# Patient Record
Sex: Female | Born: 1965
Health system: Southern US, Community
[De-identification: ages and names within clinical notes are randomized; demographics above are authoritative.]

## PROBLEM LIST (undated history)

## (undated) DIAGNOSIS — E785 Hyperlipidemia, unspecified: Secondary | ICD-10-CM

## (undated) DIAGNOSIS — Z86010 Personal history of colonic polyps: Secondary | ICD-10-CM

## (undated) DIAGNOSIS — R87619 Unspecified abnormal cytological findings in specimens from cervix uteri: Secondary | ICD-10-CM

## (undated) DIAGNOSIS — D219 Benign neoplasm of connective and other soft tissue, unspecified: Secondary | ICD-10-CM

## (undated) HISTORY — DX: Personal history of colonic polyps: Z86.010

## (undated) HISTORY — PX: COLONOSCOPY: SHX174

## (undated) HISTORY — DX: Hyperlipidemia, unspecified: E78.5

## (undated) HISTORY — DX: Unspecified abnormal cytological findings in specimens from cervix uteri: R87.619

## (undated) HISTORY — DX: Benign neoplasm of connective and other soft tissue, unspecified: D21.9

---

## 1997-12-23 ENCOUNTER — Encounter: Admission: RE | Admit: 1997-12-23 | Discharge: 1997-12-23 | Payer: Self-pay | Admitting: Sports Medicine

## 1998-02-20 ENCOUNTER — Encounter: Admission: RE | Admit: 1998-02-20 | Discharge: 1998-02-20 | Payer: Self-pay | Admitting: Family Medicine

## 1998-02-22 ENCOUNTER — Encounter: Admission: RE | Admit: 1998-02-22 | Discharge: 1998-05-23 | Payer: Self-pay | Admitting: *Deleted

## 1998-03-21 ENCOUNTER — Encounter: Admission: RE | Admit: 1998-03-21 | Discharge: 1998-03-21 | Payer: Self-pay | Admitting: Sports Medicine

## 1998-05-31 ENCOUNTER — Encounter: Admission: RE | Admit: 1998-05-31 | Discharge: 1998-05-31 | Payer: Self-pay | Admitting: Family Medicine

## 1998-06-07 ENCOUNTER — Encounter: Admission: RE | Admit: 1998-06-07 | Discharge: 1998-06-07 | Payer: Self-pay | Admitting: Family Medicine

## 1998-07-07 ENCOUNTER — Encounter: Admission: RE | Admit: 1998-07-07 | Discharge: 1998-07-07 | Payer: Self-pay | Admitting: Family Medicine

## 1998-08-21 ENCOUNTER — Encounter: Admission: RE | Admit: 1998-08-21 | Discharge: 1998-08-21 | Payer: Self-pay | Admitting: Sports Medicine

## 1999-02-20 ENCOUNTER — Encounter: Admission: RE | Admit: 1999-02-20 | Discharge: 1999-02-20 | Payer: Self-pay | Admitting: Family Medicine

## 1999-07-17 ENCOUNTER — Encounter: Admission: RE | Admit: 1999-07-17 | Discharge: 1999-07-17 | Payer: Self-pay | Admitting: Family Medicine

## 1999-10-24 ENCOUNTER — Encounter: Admission: RE | Admit: 1999-10-24 | Discharge: 1999-10-24 | Payer: Self-pay | Admitting: Family Medicine

## 2000-07-29 ENCOUNTER — Encounter: Admission: RE | Admit: 2000-07-29 | Discharge: 2000-07-29 | Payer: Self-pay | Admitting: Family Medicine

## 2000-08-06 ENCOUNTER — Encounter: Admission: RE | Admit: 2000-08-06 | Discharge: 2000-08-06 | Payer: Self-pay | Admitting: Family Medicine

## 2000-10-31 ENCOUNTER — Encounter: Admission: RE | Admit: 2000-10-31 | Discharge: 2000-10-31 | Payer: Self-pay | Admitting: Family Medicine

## 2001-03-26 ENCOUNTER — Encounter: Admission: RE | Admit: 2001-03-26 | Discharge: 2001-03-26 | Payer: Self-pay | Admitting: Family Medicine

## 2001-08-28 ENCOUNTER — Encounter: Admission: RE | Admit: 2001-08-28 | Discharge: 2001-08-28 | Payer: Self-pay | Admitting: Family Medicine

## 2002-03-05 ENCOUNTER — Encounter: Admission: RE | Admit: 2002-03-05 | Discharge: 2002-03-05 | Payer: Self-pay | Admitting: Family Medicine

## 2002-08-12 ENCOUNTER — Encounter: Admission: RE | Admit: 2002-08-12 | Discharge: 2002-08-12 | Payer: Self-pay | Admitting: Sports Medicine

## 2002-08-16 ENCOUNTER — Encounter: Admission: RE | Admit: 2002-08-16 | Discharge: 2002-08-16 | Payer: Self-pay | Admitting: Family Medicine

## 2002-08-30 ENCOUNTER — Encounter: Admission: RE | Admit: 2002-08-30 | Discharge: 2002-08-30 | Payer: Self-pay | Admitting: Family Medicine

## 2003-11-01 ENCOUNTER — Encounter: Admission: RE | Admit: 2003-11-01 | Discharge: 2003-11-01 | Payer: Self-pay | Admitting: Family Medicine

## 2004-09-21 ENCOUNTER — Ambulatory Visit: Payer: Self-pay | Admitting: Family Medicine

## 2004-12-29 ENCOUNTER — Encounter (INDEPENDENT_AMBULATORY_CARE_PROVIDER_SITE_OTHER): Payer: Self-pay | Admitting: *Deleted

## 2004-12-29 LAB — CONVERTED CEMR LAB

## 2005-01-15 ENCOUNTER — Ambulatory Visit: Payer: Self-pay | Admitting: Family Medicine

## 2005-01-17 ENCOUNTER — Ambulatory Visit (HOSPITAL_COMMUNITY): Admission: RE | Admit: 2005-01-17 | Discharge: 2005-01-17 | Payer: Self-pay | Admitting: Family Medicine

## 2006-03-03 ENCOUNTER — Ambulatory Visit: Payer: Self-pay | Admitting: Family Medicine

## 2006-03-07 ENCOUNTER — Ambulatory Visit (HOSPITAL_COMMUNITY): Admission: RE | Admit: 2006-03-07 | Discharge: 2006-03-07 | Payer: Self-pay | Admitting: Family Medicine

## 2006-03-14 ENCOUNTER — Ambulatory Visit: Payer: Self-pay | Admitting: Family Medicine

## 2006-09-24 ENCOUNTER — Ambulatory Visit: Payer: Self-pay | Admitting: Family Medicine

## 2006-10-23 DIAGNOSIS — M545 Low back pain, unspecified: Secondary | ICD-10-CM | POA: Insufficient documentation

## 2006-10-23 DIAGNOSIS — E739 Lactose intolerance, unspecified: Secondary | ICD-10-CM | POA: Insufficient documentation

## 2006-10-24 ENCOUNTER — Encounter (INDEPENDENT_AMBULATORY_CARE_PROVIDER_SITE_OTHER): Payer: Self-pay | Admitting: *Deleted

## 2007-06-23 ENCOUNTER — Encounter: Payer: Self-pay | Admitting: Family Medicine

## 2007-06-23 ENCOUNTER — Ambulatory Visit: Payer: Self-pay | Admitting: Family Medicine

## 2007-11-06 ENCOUNTER — Encounter: Payer: Self-pay | Admitting: Family Medicine

## 2008-04-15 ENCOUNTER — Telehealth: Payer: Self-pay | Admitting: Family Medicine

## 2008-05-30 ENCOUNTER — Telehealth: Payer: Self-pay | Admitting: *Deleted

## 2008-05-30 ENCOUNTER — Ambulatory Visit: Payer: Self-pay | Admitting: Family Medicine

## 2008-05-30 ENCOUNTER — Encounter: Payer: Self-pay | Admitting: Family Medicine

## 2008-05-30 DIAGNOSIS — R300 Dysuria: Secondary | ICD-10-CM | POA: Insufficient documentation

## 2008-05-30 DIAGNOSIS — R109 Unspecified abdominal pain: Secondary | ICD-10-CM | POA: Insufficient documentation

## 2008-05-30 LAB — CONVERTED CEMR LAB
ALT: 17 units/L (ref 0–35)
Albumin: 4.4 g/dL (ref 3.5–5.2)
Alkaline Phosphatase: 50 units/L (ref 39–117)
CO2: 21 meq/L (ref 19–32)
Calcium: 9.2 mg/dL (ref 8.4–10.5)
Chloride: 105 meq/L (ref 96–112)
Cholesterol: 192 mg/dL (ref 0–200)
Hemoglobin: 12.6 g/dL (ref 12.0–15.0)
MCV: 91.4 fL (ref 78.0–100.0)
Nitrite: NEGATIVE
Platelets: 286 10*3/uL (ref 150–400)
Potassium: 4.1 meq/L (ref 3.5–5.3)
Protein, U semiquant: NEGATIVE
RBC: 4.18 M/uL (ref 3.87–5.11)
Sodium: 138 meq/L (ref 135–145)
Total CHOL/HDL Ratio: 2.6
Triglycerides: 51 mg/dL (ref <150)
Urobilinogen, UA: 0.2
VLDL: 10 mg/dL (ref 0–40)
WBC Urine, dipstick: NEGATIVE

## 2008-10-04 ENCOUNTER — Encounter: Payer: Self-pay | Admitting: Family Medicine

## 2008-10-04 ENCOUNTER — Ambulatory Visit: Payer: Self-pay | Admitting: Family Medicine

## 2008-10-06 ENCOUNTER — Encounter: Payer: Self-pay | Admitting: Family Medicine

## 2008-10-24 ENCOUNTER — Ambulatory Visit (HOSPITAL_COMMUNITY): Admission: RE | Admit: 2008-10-24 | Discharge: 2008-10-24 | Payer: Self-pay | Admitting: Internal Medicine

## 2009-06-13 LAB — CONVERTED CEMR LAB
HDL: 63.5 mg/dL
LDL Cholesterol: 108 mg/dL
Total CHOL/HDL Ratio: 1.7
Triglycerides: 63 mg/dL

## 2009-10-06 ENCOUNTER — Ambulatory Visit: Payer: Self-pay | Admitting: Family Medicine

## 2009-10-06 ENCOUNTER — Encounter: Payer: Self-pay | Admitting: Family Medicine

## 2009-10-06 LAB — CONVERTED CEMR LAB
ALT: 13 units/L (ref 0–35)
Albumin: 4.2 g/dL (ref 3.5–5.2)
Alkaline Phosphatase: 47 units/L (ref 39–117)
BUN: 14 mg/dL (ref 6–23)
Blood in Urine, dipstick: NEGATIVE
Calcium: 9.4 mg/dL (ref 8.4–10.5)
Chloride: 105 meq/L (ref 96–112)
Glucose, Bld: 102 mg/dL — ABNORMAL HIGH (ref 70–99)
HCT: 38.6 % (ref 36.0–46.0)
MCHC: 32.9 g/dL (ref 30.0–36.0)
Pap Smear: NEGATIVE
Potassium: 4 meq/L (ref 3.5–5.3)
Protein, U semiquant: NEGATIVE
Sodium: 138 meq/L (ref 135–145)
Total Bilirubin: 0.5 mg/dL (ref 0.3–1.2)
Urobilinogen, UA: 0.2
Whiff Test: NEGATIVE

## 2009-10-09 ENCOUNTER — Encounter: Payer: Self-pay | Admitting: Family Medicine

## 2010-09-20 ENCOUNTER — Ambulatory Visit (HOSPITAL_COMMUNITY)
Admission: RE | Admit: 2010-09-20 | Discharge: 2010-09-20 | Payer: Self-pay | Source: Home / Self Care | Attending: Family Medicine | Admitting: Family Medicine

## 2010-09-25 NOTE — Letter (Signed)
Summary: Generic Letter  [facility]  [address]   [place], [place] [address]   Phone: [phone]  Fax: [phone]    10/09/2009  Samantha Key [address] [place], [place]  [address]  Dear [name],   All of your lab work was normal.  Your fasting blood sugar was 102, a little high but not diagnostic of diabetes.  I recommend exercise and weight loss to prevent developing diabetes.        Sincerely,   Luretha Murphy NP  Appended Document: Generic Letter mailed.

## 2010-09-25 NOTE — Assessment & Plan Note (Signed)
Summary: cpe,tcb   Vital Signs:  Patient profile:   45 year old female Height:      59.5 inches Weight:      153.4 pounds BMI:     30.57 Pulse rate:   80 / minute BP sitting:   110 / 70  (right arm)  Vitals Entered By: Arlyss Repress CMA, (October 06, 2009 8:36 AM) CC: physical with pap. re-check urine. Is Patient Diabetic? No Pain Assessment Patient in pain? no        CC:  physical with pap. re-check urine.Marland Kitchen  History of Present Illness: Here for yearly CPE, she still has complaint of her urine having a odd appearance when in the toilet, and the warm sensation over her abdomen.  This have been chronic ongoing sensations for which we have not found a cause.    Otherwise she admits to being lazy and not exercising, she eats healthy but also consumes a fair amount of sugar.  She had a recent lipid panel, and HIV test for insurance.    Back pain is improved, she does have pain down her right legs occasionally.  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  Current Medications (verified): 1)  None  Review of Systems General:  Denies fatigue, malaise, sleep disorder, and sweats. CV:  Complains of swelling of feet; denies chest pain or discomfort. Resp:  Denies cough and wheezing. GI:  Complains of abdominal pain; denies constipation and diarrhea. GU:  Complains of dysuria and urinary frequency; denies discharge and urinary hesitancy. MS:  Complains of low back pain. Derm:  Denies dryness, itching, and rash. Psych:  Denies depression.  Physical Exam  General:  alert, well-developed, well-nourished, and overweight-appearing.   Ears:  External ear exam shows no significant lesions or deformities.  Otoscopic examination reveals clear canals, tympanic membranes are intact bilaterally without bulging, retraction, inflammation or discharge. Hearing is grossly normal bilaterally. Mouth:  Oral mucosa and oropharynx without lesions or exudates.  Teeth in good  repair. Neck:  No deformities, masses, or tenderness noted. Breasts:  No mass, nodules, thickening, tenderness, bulging, retraction, inflamation, nipple discharge or skin changes noted.   Lungs:  Normal respiratory effort, chest expands symmetrically. Lungs are clear to auscultation, no crackles or wheezes. Heart:  Normal rate and regular rhythm. S1 and S2 normal without gallop, murmur, click, rub or other extra sounds. Abdomen:  Bowel sounds positive,abdomen soft and non-tender without masses, organomegaly or hernias noted. Genitalia:  Normal introitus for age, no external lesions, no vaginal discharge, mucosa pink and moist, no vaginal or cervical lesions, no vaginal atrophy, no friaility or hemorrhage, normal uterus size and position, no adnexal masses or tenderness Msk:  No deformity or scoliosis noted of thoracic or lumbar spine.   Pulses:  R and L carotid,radial,femoral,dorsalis pedis and posterior tibial pulses are full and equal bilaterally Extremities:  No clubbing, cyanosis, edema, or deformity noted with normal full range of motion of all joints.   Neurologic:  alert & oriented X3, strength normal in all extremities, and gait normal.   Skin:  Hyperpigmentation of neck, under arms and line across abdomen, Cervical Nodes:  No lymphadenopathy noted Axillary Nodes:  No palpable lymphadenopathy Inguinal Nodes:  No significant adenopathy   Impression & Recommendations:  Problem # 1:  SCREENING FOR MALIGNANT NEOPLASM OF THE CERVIX (ICD-V76.2)  Orders: Pap Smear-[facility] ([id number]) [facility] - Est  40-64 yrs ([address])  Problem # 2:  ABDOMINAL PAIN, UNSPECIFIED SITE (ICD-789.00) Add type  burning type pain that is intermittent and has been present for over one year. [phone] Comp Met-[facility] [phone]) CBC-[facility] ([address])  Problem # 3:  DYSURIA (ICD-788.1) Normal UA, high Sp Grvity:  encouraged increase in water intake Orders: Urinalysis-[facility] (00000)  Problem # 4:  GLUCOSE  INTOLERANCE (ICD-271.3)  Check CMET today  Orders: [facility] - Est  40-64 yrs ([address])  Patient Instructions: 1)  You are doing well, keep up the good work. 2)  Try not to be lazy and begin at least a walking program 4-5 times per week for 30 minutes. 3)  Mammogram in one year, every 2 year schedule until 48.  Laboratory Results   Urine Tests  Date/Time Received: October 06, 2009 8:46 AM  Date/Time Reported: October 06, 2009 8:54 AM   Routine Urinalysis   Color: yellow Appearance: Clear Glucose: negative   (Normal Range: Negative) Bilirubin: negative   (Normal Range: Negative) Ketone: negative   (Normal Range: Negative) Spec. Gravity: >=1.030   (Normal Range: 1.003-1.035) Blood: negative   (Normal Range: Negative) pH: 5.5   (Normal Range: 5.0-8.0) Protein: negative   (Normal Range: Negative) Urobilinogen: 0.2   (Normal Range: 0-1) Nitrite: negative   (Normal Range: Negative) Leukocyte Esterace: negative   (Normal Range: Negative)    Comments: ...............test performed by......Marland KitchenBonnie A. Swazil[place], MLS (ASCP)cm  Date/Time Received: October 06, 2009 9:07 AM  Date/Time Reported: October 06, 2009 9:13 AM   [facility] Source: vaginal WBC/hpf: 0-2 Bacteria/hpf: 2+  Rods Clue cells/hpf: none  Negative whiff Yeast/hpf: none Trichomonas/hpf: none Comments: ...........test performed by...........Marland KitchenTerese Door, CMA

## 2010-09-25 NOTE — Miscellaneous (Signed)
  Clinical Lists Changes  Observations: Added new observation of PAPRECACT: Ordered (10/06/2009 9:28) Added new observation of FLUVAXDECLN: Refused (10/06/2009 9:28) Added new observation of DM PROGRESS: N/A (10/06/2009 9:28) Added new observation of DM FSREVIEW: N/A (10/06/2009 9:28) Added new observation of HTN PROGRESS: N/A (10/06/2009 9:28) Added new observation of HTN FSREVIEW: N/A (10/06/2009 9:28) Added new observation of LIPID PROGRS: N/A (10/06/2009 9:28) Added new observation of LIPID FSREVW: N/A (10/06/2009 9:28) Added new observation of MAMMOGRAM: Done through scholarship at [facility] test (11/28/2008 9:29)      Prevention & Chronic Care Immunizations   Influenza vaccine: Not documented   Influenza vaccine deferral: Refused  (10/06/2009)   Influenza vaccine due: Refused  (10/04/2008)    Tetanus booster: 06/27/1999: Done.   Tetanus booster due: 06/26/2009    Pneumococcal vaccine: Not documented  Other Screening   Pap smear: NEGATIVE FOR INTRAEPITHELIAL LESIONS OR MALIGNANCY.  (10/04/2008)   Pap smear action/deferral: Ordered  (10/06/2009)   Pap smear due: 12/29/2005    Mammogram: Done through scholarship at [facility] test  (11/28/2008)   Mammogram due: 03/02/2007   Smoking status: never  (10/04/2008)  Lipids   Total Cholesterol: 185  (06/13/2009)   LDL: 108  (06/13/2009)   LDL Direct: Not documented   HDL: 63.5  (06/13/2009)   Triglycerides: 63  (06/13/2009)    Mammogram  Procedure date:  11/28/2008  Findings:      Done through scholarship at [facility] test

## 2010-10-19 ENCOUNTER — Encounter: Payer: Self-pay | Admitting: Family Medicine

## 2010-10-19 ENCOUNTER — Ambulatory Visit (INDEPENDENT_AMBULATORY_CARE_PROVIDER_SITE_OTHER): Payer: BC Managed Care – PPO | Admitting: Family Medicine

## 2010-10-19 DIAGNOSIS — Z124 Encounter for screening for malignant neoplasm of cervix: Secondary | ICD-10-CM

## 2010-10-19 DIAGNOSIS — N760 Acute vaginitis: Secondary | ICD-10-CM

## 2010-10-19 DIAGNOSIS — E669 Obesity, unspecified: Secondary | ICD-10-CM

## 2010-10-19 DIAGNOSIS — R7309 Other abnormal glucose: Secondary | ICD-10-CM

## 2010-10-19 DIAGNOSIS — R7302 Impaired glucose tolerance (oral): Secondary | ICD-10-CM

## 2010-10-19 DIAGNOSIS — Z0000 Encounter for general adult medical examination without abnormal findings: Secondary | ICD-10-CM | POA: Insufficient documentation

## 2010-10-19 DIAGNOSIS — Z23 Encounter for immunization: Secondary | ICD-10-CM

## 2010-10-19 LAB — CBC
HCT: 39.3 % (ref 36.0–46.0)
Hemoglobin: 12.6 g/dL (ref 12.0–15.0)
MCHC: 32.1 g/dL (ref 30.0–36.0)
Platelets: 294 10*3/uL (ref 150–400)

## 2010-10-19 LAB — CONVERTED CEMR LAB
AST: 21 units/L (ref 0–37)
Albumin: 4.3 g/dL (ref 3.5–5.2)
Cholesterol: 191 mg/dL (ref 0–200)
Glucose, Bld: 98 mg/dL (ref 70–99)
HCT: 39.3 % (ref 36.0–46.0)
MCHC: 32.1 g/dL (ref 30.0–36.0)
MCV: 93.1 fL (ref 78.0–100.0)
Platelets: 294 10*3/uL (ref 150–400)
Potassium: 4 meq/L (ref 3.5–5.3)
RDW: 12.9 % (ref 11.5–15.5)
TSH: 1.059 microintl units/mL (ref 0.350–4.500)
Total Bilirubin: 0.5 mg/dL (ref 0.3–1.2)

## 2010-10-19 LAB — COMPREHENSIVE METABOLIC PANEL
ALT: 23 U/L (ref 0–35)
AST: 21 U/L (ref 0–37)
Albumin: 4.3 g/dL (ref 3.5–5.2)
Alkaline Phosphatase: 47 U/L (ref 39–117)
BUN: 15 mg/dL (ref 6–23)
Calcium: 9.6 mg/dL (ref 8.4–10.5)
Creat: 0.71 mg/dL (ref 0.40–1.20)
Glucose, Bld: 98 mg/dL (ref 70–99)
Potassium: 4 mEq/L (ref 3.5–5.3)
Total Protein: 7.8 g/dL (ref 6.0–8.3)

## 2010-10-19 LAB — POCT WET PREP (WET MOUNT): Yeast Wet Prep HPF POC: NEGATIVE

## 2010-10-19 NOTE — Progress Notes (Signed)
  Subjective:    Patient ID: Samantha Key, female    DOB: [birth year], 45 y.o.   MRN: [id number]  HPI : Patient is here for annual exam and PAP.  She reports only a few complaints.  She has a hot sensation in her abdomen that she has described for years that comes and goes.  She denies pain, or associated symptoms.  She has a small amount of yellow discharge, menses are regular, has occasional hot flashes.    Review of Systems  Constitutional: Negative for activity change, appetite change and unexpected weight change.  HENT: Negative for congestion.   Eyes: Negative for visual disturbance.  Respiratory: Negative for cough and shortness of breath.   Cardiovascular: Negative for chest pain and leg swelling.  Genitourinary: Positive for vaginal discharge. Negative for dysuria.  Musculoskeletal: Negative for back pain.  Neurological: Negative for headaches.  Psychiatric/Behavioral: Negative for dysphoric mood.       Objective:   Physical Exam  Constitutional: She is oriented to person, place, and time. She appears well-developed.       Overweight appearing  HENT:  Right Ear: External ear normal.  Left Ear: External ear normal.  Nose: Nose normal.  Mouth/Throat: Oropharynx is clear and moist.  Eyes: Conjunctivae and EOM are normal. Pupils are equal, round, and reactive to light.  Neck: Normal range of motion.  Cardiovascular: Normal rate, regular rhythm, normal heart sounds and intact distal pulses.   Pulmonary/Chest: Effort normal and breath sounds normal.  Abdominal: Soft. Bowel sounds are normal.  Genitourinary: Vagina normal and uterus normal.  Musculoskeletal: Normal range of motion.  Neurological: She is alert and oriented to person, place, and time.  Skin: Skin is warm and dry.          Assessment & Plan:

## 2010-10-19 NOTE — Assessment & Plan Note (Signed)
Completed, can probably go to every 2 year screening.  Trich found on wet mount, will call patient to discuss treatment plan.

## 2010-10-19 NOTE — Assessment & Plan Note (Signed)
Routine labs, no abnormal findings

## 2010-10-19 NOTE — Patient Instructions (Signed)
Best way to loose weight is by exercising and changing diet to include natural food from the earth Eliminate sugars, do not drink calories-just water Meat at 3 ounces a day, size of a deck of cards I will send you the results of your PAP and labs in the mail, and will call you if anything is abnormal

## 2010-10-22 ENCOUNTER — Other Ambulatory Visit: Payer: Self-pay | Admitting: Family Medicine

## 2010-10-22 DIAGNOSIS — A5901 Trichomonal vulvovaginitis: Secondary | ICD-10-CM | POA: Insufficient documentation

## 2010-10-22 MED ORDER — METRONIDAZOLE 500 MG PO TABS: 500.0000 mg | ORAL_TABLET | ORAL | Status: AC

## 2010-10-26 ENCOUNTER — Encounter: Payer: Self-pay | Admitting: Family Medicine

## 2010-10-26 ENCOUNTER — Ambulatory Visit (INDEPENDENT_AMBULATORY_CARE_PROVIDER_SITE_OTHER): Payer: BC Managed Care – PPO | Admitting: Family Medicine

## 2010-10-26 VITALS — BP 142/86 | HR 119 | Temp 99.4°F | Wt 155.5 lb

## 2010-10-26 DIAGNOSIS — A5901 Trichomonal vulvovaginitis: Secondary | ICD-10-CM

## 2010-10-26 DIAGNOSIS — N898 Other specified noninflammatory disorders of vagina: Secondary | ICD-10-CM

## 2010-10-26 NOTE — Assessment & Plan Note (Addendum)
Three confirmation tests, personally reviewed wet mount slide + mobile trich

## 2010-10-26 NOTE — Progress Notes (Signed)
  Subjective:    Patient ID: Samantha Key, female    DOB: [birth year], 45 y.o.   MRN: [id number]  HPI Patient is very upset that trich was detected on wet prep and confirmed on PAP.  She does not believe this, she and her husband have gone round and round.  She looked it up on the intranet and found that it can be transmitted by toilet seats and that is can be chronic. One year ago she had a PAP with no report of trich.   Today she also brought in a cup of water and a bottle of household bleach, she "demonstrated" how her urine looks in the toilet.  She has complained about this many times and for years.  She thinks there is something wrong because her urine does not mix with the water in the toilet.  I have explained that it is a different specific gravity and that she can see a specialist if she prefers.   Review of Systems  Genitourinary: Negative for dysuria, vaginal bleeding, vaginal discharge, difficulty urinating, genital sores, vaginal pain, pelvic pain and dyspareunia.  Psychiatric/Behavioral:       Very upset       Objective:   Physical Exam  Constitutional: She appears well-developed.  Genitourinary: Vagina normal. No vaginal discharge found.       Normal external exam, vaginal walls pink and dry.  No discharge.          Assessment & Plan:

## 2010-12-11 LAB — GLUCOSE, CAPILLARY: Glucose-Capillary: 101 mg/dL — ABNORMAL HIGH (ref 70–99)

## 2011-09-16 ENCOUNTER — Other Ambulatory Visit: Payer: Self-pay | Admitting: Family Medicine

## 2011-09-16 DIAGNOSIS — Z1231 Encounter for screening mammogram for malignant neoplasm of breast: Secondary | ICD-10-CM

## 2011-09-24 ENCOUNTER — Ambulatory Visit (INDEPENDENT_AMBULATORY_CARE_PROVIDER_SITE_OTHER): Payer: BC Managed Care – PPO | Admitting: Family Medicine

## 2011-09-24 ENCOUNTER — Encounter: Payer: Self-pay | Admitting: Family Medicine

## 2011-09-24 VITALS — BP 151/86 | HR 67 | Temp 98.2°F | Ht 59.5 in | Wt 160.0 lb

## 2011-09-24 DIAGNOSIS — M5430 Sciatica, unspecified side: Secondary | ICD-10-CM

## 2011-09-24 MED ORDER — NAPROXEN 500 MG PO TABS: 500.0000 mg | ORAL_TABLET | Freq: Two times a day (BID) | ORAL | Status: DC

## 2011-09-24 NOTE — Patient Instructions (Addendum)
Sciatica Sciatica is a weakness and/or changes in sensation (tingling, jolts, hot and cold, numbness) along the path the sciatic nerve travels. Irritation or damage to lumbar nerve roots is often also referred to as lumbar radiculopathy.  Lumbar radiculopathy (Sciatica) is the most common form of this problem. Radiculopathy can occur in any of the nerves coming out of the spinal cord. The problems caused depend on which nerves are involved. The sciatic nerve is the large nerve supplying the branches of nerves going from the hip to the toes. It often causes a numbness or weakness in the skin and/or muscles that the sciatic nerve serves. It also may cause symptoms (problems) of pain, burning, tingling, or electric shock-like feelings in the path of this nerve. This usually comes from injury to the fibers that make up the sciatic nerve. Some of these symptoms are low back pain and/or unpleasant feelings in the following areas:  From the mid-buttock down the back of the leg to the back of the knee.   And/or the outside of the calf and top of the foot.   And/or behind the inner ankle to the sole of the foot.  CAUSES   Herniated or slipped disc. Discs are the little cushions between the bones in the back.   Pressure by the piriformis muscle in the buttock on the sciatic nerve (Piriformis Syndrome).   Misalignment of the bones in the lower back and buttocks (Sacroiliac Joint Derangement).   Narrowing of the spinal canal that puts pressure on or pinches the fibers that make up the sciatic nerve.   A slipped vertebra that is out of line with those above or beneath it.   Abnormality of the nervous system itself so that nerve fibers do not transmit signals properly, especially to feet and calves (neuropathy).   Tumor (this is rare).  Your caregiver can usually determine the cause of your sciatica and begin the treatment most likely to help you. TREATMENT  Taking over-the-counter painkillers, physical  therapy, rest, exercise, spinal manipulation, and injections of anesthetics and/or steroids may be used. Surgery, acupuncture, and Yoga can also be effective. Mind over matter techniques, mental imagery, and changing factors such as your bed, chair, desk height, posture, and activities are other treatments that may be helpful. You and your caregiver can help determine what is best for you. With proper diagnosis, the cause of most sciatica can be identified and removed. Communication and cooperation between your caregiver and you is essential. If you are not successful immediately, do not be discouraged. With time, a proper treatment can be found that will make you comfortable. HOME CARE INSTRUCTIONS   If the pain is coming from a problem in the back, applying ice to that area for 15 to 20 minutes, 3 to 4 times per day while awake, may be helpful. Put the ice in a plastic bag. Place a towel between the bag of ice and your skin.   You may exercise or perform your usual activities if these do not aggravate your pain, or as suggested by your caregiver.   Only take over-the-counter or prescription medicines for pain, discomfort, or fever as directed by your caregiver.   If your caregiver has given you a follow-up appointment, it is very important to keep that appointment. Not keeping the appointment could result in a chronic or permanent injury, pain, and disability. If there is any problem keeping the appointment, you must call back to this facility for assistance.  SEEK IMMEDIATE MEDICAL CARE   IF:   You experience loss of control of bowel or bladder.   You have increasing weakness in the trunk, buttocks, or legs.   There is numbness in any areas from the hip down to the toes.   You have difficulty walking or keeping your balance.   You have any of the above, with fever or forceful vomiting.  Document Released: 08/06/2001 Document Revised: 04/24/2011 Document Reviewed: 03/25/2008 [facility] Patient  Information 2012 [place], [place]. Arterial Hypertension Arterial hypertension (high blood pressure) is a condition of elevated pressure in your blood vessels. Hypertension over a long period of time is a risk factor for strokes, heart attacks, and heart failure. It is also the leading cause of kidney (renal) failure.  CAUSES   In Adults -- Over 90% of all hypertension has no known cause. This is called essential or primary hypertension. In the other 10% of people with hypertension, the increase in blood pressure is caused by another disorder. This is called secondary hypertension. Important causes of secondary hypertension are:   Heavy alcohol use.   Obstructive sleep apnea.   Hyperaldosterosim (Conn's syndrome).   Steroid use.   Chronic kidney failure.   Hyperparathyroidism.   Medications.   Renal artery stenosis.   Pheochromocytoma.   Cushing's disease.   Coarctation of the aorta.   Scleroderma renal crisis.   Licorice (in excessive amounts).   Drugs (cocaine, methamphetamine).  Your caregiver can explain any items above that apply to you.  In Children -- Secondary hypertension is more common and should always be considered.   Pregnancy -- Few women of childbearing age have high blood pressure. However, up to 10% of them develop hypertension of pregnancy. Generally, this will not harm the woman. It may be a sign of 3 complications of pregnancy: preeclampsia, HELLP syndrome, and eclampsia. Follow up and control with medication is necessary.  SYMPTOMS   This condition normally does not produce any noticeable symptoms. It is usually found during a routine exam.   Malignant hypertension is a late problem of high blood pressure. It may have the following symptoms:   Headaches.   Blurred vision.   End-organ damage (this means your kidneys, heart, lungs, and other organs are being damaged).   Stressful situations can increase the blood pressure. If a person with normal  blood pressure has their blood pressure go up while being seen by their caregiver, this is often termed "white coat hypertension." Its importance is not known. It may be related with eventually developing hypertension or complications of hypertension.   Hypertension is often confused with mental tension, stress, and anxiety.  DIAGNOSIS  The diagnosis is made by 3 separate blood pressure measurements. They are taken at least 1 week apart from each other. If there is organ damage from hypertension, the diagnosis may be made without repeat measurements. Hypertension is usually identified by having blood pressure readings:  Above 140/90 mmHg measured in both arms, at 3 separate times, over a couple weeks.   Over 130/80 mmHg should be considered a risk factor and may require treatment in patients with diabetes.  Blood pressure readings over 120/80 mmHg are called "pre-hypertension" even in non-diabetic patients. To get a true blood pressure measurement, use the following guidelines. Be aware of the factors that can alter blood pressure readings.  Take measurements at least 1 hour after caffeine.   Take measurements 30 minutes after smoking and without any stress. This is another reason to quit smoking - it raises your blood pressure.  Use a proper cuff size. Ask your caregiver if you are not sure about your cuff size.   Most home blood pressure cuffs are automatic. They will measure systolic and diastolic pressures. The systolic pressure is the pressure reading at the start of sounds. Diastolic pressure is the pressure at which the sounds disappear. If you are elderly, measure pressures in multiple postures. Try sitting, lying or standing.   Sit at rest for a minimum of 5 minutes before taking measurements.   You should not be on any medications like decongestants. These are found in many cold medications.   Record your blood pressure readings and review them with your caregiver.  If you have  hypertension:  Your caregiver may do tests to be sure you do not have secondary hypertension (see "causes" above).   Your caregiver may also look for signs of metabolic syndrome. This is also called Syndrome X or Insulin Resistance Syndrome. You may have this syndrome if you have type 2 diabetes, abdominal obesity, and abnormal blood lipids in addition to hypertension.   Your caregiver will take your medical and family history and perform a physical exam.   Diagnostic tests may include blood tests (for glucose, cholesterol, potassium, and kidney function), a urinalysis, or an EKG. Other tests may also be necessary depending on your condition.  PREVENTION  There are important lifestyle issues that you can adopt to reduce your chance of developing hypertension:  Maintain a normal weight.   Limit the amount of salt (sodium) in your diet.   Exercise often.   Limit alcohol intake.   Get enough potassium in your diet. Discuss specific advice with your caregiver.   Follow a DASH diet (dietary approaches to stop hypertension). This diet is rich in fruits, vegetables, and low-fat dairy products, and avoids certain fats.  PROGNOSIS  Essential hypertension cannot be cured. Lifestyle changes and medical treatment can lower blood pressure and reduce complications. The prognosis of secondary hypertension depends on the underlying cause. Many people whose hypertension is controlled with medicine or lifestyle changes can live a normal, healthy life.  RISKS AND COMPLICATIONS  While high blood pressure alone is not an illness, it often requires treatment due to its short- and long-term effects on many organs. Hypertension increases your risk for:  CVAs or strokes (cerebrovascular accident).   Heart failure due to chronically high blood pressure (hypertensive cardiomyopathy).   Heart attack (myocardial infarction).   Damage to the retina (hypertensive retinopathy).   Kidney failure (hypertensive  nephropathy).  Your caregiver can explain list items above that apply to you. Treatment of hypertension can significantly reduce the risk of complications. TREATMENT   For overweight patients, weight loss and regular exercise are recommended. Physical fitness lowers blood pressure.   Mild hypertension is usually treated with diet and exercise. A diet rich in fruits and vegetables, fat-free dairy products, and foods low in fat and salt (sodium) can help lower blood pressure. Decreasing salt intake decreases blood pressure in a 1/3 of people.   Stop smoking if you are a smoker.  The steps above are highly effective in reducing blood pressure. While these actions are easy to suggest, they are difficult to achieve. Most patients with moderate or severe hypertension end up requiring medications to bring their blood pressure down to a normal level. There are several classes of medications for treatment. Blood pressure pills (antihypertensives) will lower blood pressure by their different actions. Lowering the blood pressure by 10 mmHg may decrease the risk of  complications by as much as 25%. The goal of treatment is effective blood pressure control. This will reduce your risk for complications. Your caregiver will help you determine the best treatment for you according to your lifestyle. What is excellent treatment for one person, may not be for you. HOME CARE INSTRUCTIONS   Do not smoke.   Follow the lifestyle changes outlined in the "Prevention" section.   If you are on medications, follow the directions carefully. Blood pressure medications must be taken as prescribed. Skipping doses reduces their benefit. It also puts you at risk for problems.   Follow up with your caregiver, as directed.   If you are asked to monitor your blood pressure at home, follow the guidelines in the "Diagnosis" section above.  SEEK MEDICAL CARE IF:   You think you are having medication side effects.   You have  recurrent headaches or lightheadedness.   You have swelling in your ankles.   You have trouble with your vision.  SEEK IMMEDIATE MEDICAL CARE IF:   You have sudden onset of chest pain or pressure, difficulty breathing, or other symptoms of a heart attack.   You have a severe headache.   You have symptoms of a stroke (such as sudden weakness, difficulty speaking, difficulty walking).  MAKE SURE YOU:   Understand these instructions.   Will watch your condition.   Will get help right away if you are not doing well or get worse.  Document Released: 08/12/2005 Document Revised: 04/24/2011 Document Reviewed: 03/12/2007 [facility] Patient Information 2012 [place], [place].

## 2011-09-24 NOTE — Progress Notes (Signed)
  Subjective:    Patient ID: Samantha Key, female    DOB: Feb 02, 1966, 46 y.o.   MRN: [id number]  Leg Pain  The incident occurred more than 1 week ago. There was no injury mechanism. The pain is present in the right thigh and right leg. The quality of the pain is described as cramping and shooting. The pain is at a severity of 5/10. The pain is moderate. The pain has been fluctuating since onset. Pertinent negatives include no numbness. She has tried nothing for the symptoms. The treatment provided no relief.      Review of Systems  Respiratory: Negative for shortness of breath.   Cardiovascular: Negative for chest pain and palpitations.  Gastrointestinal: Negative for abdominal pain.  Genitourinary: Negative for dysuria.  Musculoskeletal: Positive for back pain and arthralgias. Negative for joint swelling.  Neurological: Negative for numbness.       Objective:   Physical Exam  Vitals reviewed. Constitutional: She is oriented to person, place, and time. She appears well-developed and well-nourished.  HENT:  Head: Normocephalic and atraumatic.  Neck: Normal range of motion.  Cardiovascular: Normal rate and regular rhythm.   Pulmonary/Chest: Effort normal.  Abdominal: Soft.  Musculoskeletal: Normal range of motion. She exhibits no edema and no tenderness.       Negative SLR, hip joint has full ROM, and is not tender.  There is no paraspinous muscle tenderness, There is no SI joint tenderness.  DTR's are 2+ and symmetric at patella.  Sensation is intact.  Neurological: She is alert and oriented to person, place, and time.          Assessment & Plan:   1. Sciatica  naproxen (NAPROSYN) 500 MG tablet   Info given see AVS

## 2011-10-14 ENCOUNTER — Ambulatory Visit (HOSPITAL_COMMUNITY)
Admission: RE | Admit: 2011-10-14 | Discharge: 2011-10-14 | Disposition: A | Discharge status: Home / Self Care | Payer: BC Managed Care – PPO | Source: Ambulatory Visit | Attending: Family Medicine | Admitting: Family Medicine

## 2011-10-14 DIAGNOSIS — Z1231 Encounter for screening mammogram for malignant neoplasm of breast: Secondary | ICD-10-CM

## 2011-10-25 ENCOUNTER — Encounter: Payer: BC Managed Care – PPO | Admitting: Emergency Medicine

## 2011-11-04 ENCOUNTER — Other Ambulatory Visit (HOSPITAL_COMMUNITY)
Admission: RE | Admit: 2011-11-04 | Discharge: 2011-11-04 | Disposition: A | Discharge status: Home / Self Care | Payer: BC Managed Care – PPO | Source: Ambulatory Visit | Attending: Family Medicine | Admitting: Family Medicine

## 2011-11-04 ENCOUNTER — Ambulatory Visit (INDEPENDENT_AMBULATORY_CARE_PROVIDER_SITE_OTHER): Payer: BC Managed Care – PPO | Admitting: Emergency Medicine

## 2011-11-04 ENCOUNTER — Encounter: Payer: Self-pay | Admitting: Emergency Medicine

## 2011-11-04 VITALS — BP 130/82 | HR 92 | Ht 59.5 in | Wt 154.0 lb

## 2011-11-04 DIAGNOSIS — L568 Other specified acute skin changes due to ultraviolet radiation: Secondary | ICD-10-CM | POA: Insufficient documentation

## 2011-11-04 DIAGNOSIS — Z0000 Encounter for general adult medical examination without abnormal findings: Secondary | ICD-10-CM | POA: Insufficient documentation

## 2011-11-04 DIAGNOSIS — Z01419 Encounter for gynecological examination (general) (routine) without abnormal findings: Secondary | ICD-10-CM | POA: Insufficient documentation

## 2011-11-04 DIAGNOSIS — Z124 Encounter for screening for malignant neoplasm of cervix: Secondary | ICD-10-CM

## 2011-11-04 LAB — LIPID PANEL
Cholesterol: 167 mg/dL (ref 0–200)
LDL Cholesterol: 95 mg/dL (ref 0–99)
Total CHOL/HDL Ratio: 3 Ratio
Triglycerides: 83 mg/dL (ref <150)
VLDL: 17 mg/dL (ref 0–40)

## 2011-11-04 LAB — CBC WITH DIFFERENTIAL/PLATELET
Basophils Absolute: 0 10*3/uL (ref 0.0–0.1)
Basophils Relative: 0 % (ref 0–1)
Eosinophils Absolute: 0.1 10*3/uL (ref 0.0–0.7)
Lymphs Abs: 1.8 10*3/uL (ref 0.7–4.0)
MCV: 94.7 fL (ref 78.0–100.0)
Neutro Abs: 4.7 10*3/uL (ref 1.7–7.7)
Neutrophils Relative %: 68 % (ref 43–77)
Platelets: 305 10*3/uL (ref 150–400)
RBC: 4.18 MIL/uL (ref 3.87–5.11)
RDW: 12.6 % (ref 11.5–15.5)
WBC: 6.9 10*3/uL (ref 4.0–10.5)

## 2011-11-04 LAB — COMPREHENSIVE METABOLIC PANEL
Alkaline Phosphatase: 46 U/L (ref 39–117)
BUN: 14 mg/dL (ref 6–23)
CO2: 23 mEq/L (ref 19–32)
Potassium: 3.8 mEq/L (ref 3.5–5.3)

## 2011-11-04 NOTE — Assessment & Plan Note (Signed)
Differential includes rheumatologic conditions. Will check ANA and ESR.  Suspect this will be isolated dermatitis given lack of other symptoms, but does have a sister with possible lupus.  Will also give triamcinolone 0.1% to use on rash, as well as encouraged use of sunscreen.  Pt expressed agreement and understanding.

## 2011-11-04 NOTE — Progress Notes (Signed)
  Subjective:    Patient ID: Samantha Key, female    DOB: [birth year], 46 y.o.   MRN: [id number]  HPI Samantha Key is here today for her annual exam and a rash.  1. Annual exam: I have reviewed and updated the following as appropriate: allergies, current medications, past family history, past medical history, past social history, past surgical history and problem list.  She states she is not very active due to her work.  She does eat a healthy diet with fruits, vegetables, broiled/steamed, milk.  Think she may be starting perimenopause with some hot flashes and periods starting to be irregular.  2. Rash: Patient describes a red, mildly raised, itchy rash that appears after exposure to the sun and resolves in 1-2 hours.  This in new in the last month.  Has had the rash on her hands/arms and on the right side of her face.  Has put a cream on it that helped the itching.  No joint pains or fatigue.  Does have a sister that possible has lupus.   Review of Systems See HPI, otherwise negative.    Objective:   Physical Exam BP 130/82  Pulse 92  Ht 4' 11.5" (1.511 m)  Wt 154 lb (69.854 kg)  BMI 30.58 kg/m2  LMP 11/03/2011 Gen: alert, pleasant, NAD HEENT: AT/[place], sclera white, PERRL, EOMI, oropharynx normal Neck: thyroid normal, supple, trachea midline, no LAD CV: RRR, no murmurs Pulm: CTAB, no wheezes or rales Abd: +BS, soft, NTND, no organomegaly GU: external genitalia normal; vagina normal; cervix with small amount of blood, 3 nabothian cysts on cervix; bimanual exam normal, ovaries not palpable, no cervical motion tenderness or adnexal fullness Ext: no edema, palpable peripheral pulses Skin: normal; no rashes      Assessment & Plan:

## 2011-11-04 NOTE — Assessment & Plan Note (Signed)
Doing well.  Diet varied with minimal fried foods and sweet.  Not very active.  Pap today.  Will also check CBC, lipid, Cmet.

## 2011-11-04 NOTE — Patient Instructions (Signed)
It was very nice to meet you!  It looks like everything is going very well.  We are going to get some lab work today. I will send a letter with the results in the next 2 weeks.  The rash is likely a photodermatitis.  It can be a sign of an underlying condition, but the lab work will look for that.  In the meantime, use sunscreen when in the sun.  If the rash develops, you can use triamcinolone cream (I sent a prescription to your pharmacy) on it to help relieve the redness and itchiness.  I will see you back in 1 year for your annual exam or sooner as needed.

## 2011-11-05 LAB — SEDIMENTATION RATE: Sed Rate: 22 mm/hr (ref 0–22)

## 2011-11-05 LAB — ANTI-NUCLEAR AB-TITER (ANA TITER): ANA Titer 1: NEGATIVE

## 2011-11-07 ENCOUNTER — Encounter: Payer: Self-pay | Admitting: Emergency Medicine

## 2011-11-07 DIAGNOSIS — L568 Other specified acute skin changes due to ultraviolet radiation: Secondary | ICD-10-CM

## 2011-11-07 MED ORDER — TRIAMCINOLONE ACETONIDE 0.1 % EX CREA: TOPICAL_CREAM | Freq: Two times a day (BID) | CUTANEOUS | Status: DC | PRN

## 2012-05-17 ENCOUNTER — Encounter (HOSPITAL_COMMUNITY): Payer: Self-pay | Admitting: Emergency Medicine

## 2012-05-17 ENCOUNTER — Emergency Department (HOSPITAL_COMMUNITY)
Admission: EM | Admit: 2012-05-17 | Discharge: 2012-05-17 | Disposition: A | Discharge status: Home / Self Care | Payer: BC Managed Care – PPO | Source: Home / Self Care | Attending: Emergency Medicine | Admitting: Emergency Medicine

## 2012-05-17 ENCOUNTER — Telehealth: Payer: Self-pay | Admitting: Family Medicine

## 2012-05-17 DIAGNOSIS — IMO0002 Reserved for concepts with insufficient information to code with codable children: Secondary | ICD-10-CM

## 2012-05-17 DIAGNOSIS — H8110 Benign paroxysmal vertigo, unspecified ear: Secondary | ICD-10-CM

## 2012-05-17 MED ORDER — MECLIZINE HCL 25 MG PO TABS: 25.0000 mg | ORAL_TABLET | Freq: Three times a day (TID) | ORAL | Status: AC | PRN

## 2012-05-17 NOTE — Telephone Encounter (Signed)
Patient describes waking up at 5 am with sensation of room spinning/vertigo. This improves when she stands up and walks around. Symptoms start again with lying down and are severe. She denies head trauma, headache, vision changes, nausea/emesis, hearing loss, falls, balance difficulty. Advise patient there are many causes of vertigo, some are benign and rarely can be serious. Advised she may present to UC this morning if symptoms do not improve to help rule out more serious pathology. She is agreeable.

## 2012-05-17 NOTE — ED Notes (Addendum)
Pt c/o of having headache yesterday with bilateral shoulder and neck pain radiating down arms. Pt states today still having same symptoms but with dizziness. Pt denies n/v.

## 2012-05-17 NOTE — ED Provider Notes (Signed)
History     CSN: [id number]  Arrival date & time 05/17/12  1152   First MD Initiated Contact with Patient 05/17/12 1158      Chief Complaint  Patient presents with  . Dizziness    dizziness this a.m    (Consider location/radiation/quality/duration/timing/severity/associated sxs/prior treatment) HPI Comments: Patient presents urgent care after guided by on call physician ( family Practice resident)  to come in to be evaluated for dizziness. She describes a for the last 2 days she's been having a mild headache and have noticed that every time she lays flat or moves abruptly especially her head things start spinning around her. She have perceived some discomfort or itchiness sensation coming out of her left ear. Denies any unusual sounds, nausea, vomiting, visual changes. Further questioning she also denies any chest pains, palpitations, shortness of breath, head injury or trauma.  Patient also describes a for several months and not associated with this and that she has had some neck discomfort with bilateral shoulder pain that occasionally radiates down to both of her arms. Denies any weakness, numbness or tingling sensations whenever upper or lower extremities. Patient also denies any constitutional symptoms such as fevers, arthralgias, myalgias, changes in appetite or unexpected weight loss.  The history is provided by the patient.    History reviewed. No pertinent past medical history.  History reviewed. No pertinent past surgical history.  Family History  Problem Relation Age of Onset  . Cancer Mother   . Diabetes Mother   . Hypertension Mother   . Cancer Father     History  Substance Use Topics  . Smoking status: Never Smoker   . Smokeless tobacco: Not on file  . Alcohol Use: No    OB History    Grav Para Term Preterm Abortions TAB SAB Ect Mult Living                  Review of Systems  Constitutional: Negative for fever, chills, diaphoresis, activity change and  appetite change.  HENT: Positive for ear pain and neck pain. Negative for neck stiffness and tinnitus.   Gastrointestinal: Negative for nausea and vomiting.  Neurological: Positive for dizziness and headaches. Negative for tremors, seizures, facial asymmetry, speech difficulty, weakness, light-headedness and numbness.  Psychiatric/Behavioral: Negative for confusion and agitation.    Allergies  Review of patient's allergies indicates no known allergies.  Home Medications   Current Outpatient Rx  Name Route Sig Dispense Refill  . ASPIRIN 81 MG PO TABS Oral Take 81 mg by mouth daily.    Marland Kitchen MECLIZINE HCL 25 MG PO TABS Oral Take 1 tablet (25 mg total) by mouth 3 (three) times daily as needed for dizziness. 15 tablet 0  . NAPROXEN 500 MG PO TABS Oral Take 1 tablet (500 mg total) by mouth 2 (two) times daily with a meal. 30 tablet 0  . TRIAMCINOLONE ACETONIDE 0.1 % EX CREA Topical Apply topically 2 (two) times daily as needed. 30 g 0    BP 146/97  Pulse 106  Temp 98.6 F (37 C) (Oral)  Resp 16  SpO2 97%  LMP 05/13/2012  Physical Exam  Nursing note and vitals reviewed. Constitutional: She appears well-developed and well-nourished. No distress.  HENT:  Right Ear: Hearing, tympanic membrane, external ear and ear canal normal.  Left Ear: Hearing, tympanic membrane, external ear and ear canal normal.  Neck: Normal range of motion. Neck supple. No spinous process tenderness and no muscular tenderness present. No rigidity. Normal range  of motion present. No Brudzinski's sign and no Kernig's sign noted.  Cardiovascular: Normal rate and regular rhythm.  Exam reveals no gallop and no friction rub.   No murmur heard. Pulmonary/Chest: Effort normal and breath sounds normal.  Musculoskeletal: She exhibits tenderness.  Neurological: She is alert. She displays no tremor. A sensory deficit is present. No cranial nerve deficit. She exhibits normal muscle tone. Coordination and gait normal. GCS eye  subscore is 4. GCS verbal subscore is 5. GCS motor subscore is 6.       During exam with positional changes predominantly when laying supine after sitting position seem to consistently exacerbate the vertigo sensation.  Hall-pike maneuver, did not seem to exacerbate the vertigo  Skin: No rash noted. No erythema.    ED Course  Procedures (including critical care time)  Labs Reviewed - No data to display No results found.   1. Positional vertigo     Normal sinus rhythm observe for 60 seconds on hand held, rhythm strip.  MDM   Patient's symptomatology was somewhat consistent with peripheral vertigo, patient had no further neurological symptoms and no deficits on exam. Patient with no cardiovascular respiratory symptoms. Have advised patient to followup with her primary care Dr. to further evaluation if her symptoms persist including the coexistent neck and shoulder pain should she described that has been occurring for several months 2 weeks. Patient agrees with treatment plan and followup care as necessary. Have also discussed what symptoms should warrant further evaluation in the emergency department. Based on her symptoms exam did not suspect this patient is experiencing a cerebrovascular event or related symptoms. We have prescribed her meclizine as a trial and encourage her to followup with her clinic if symptoms were not to persist or to go to the emergency department if symptoms worsen      Jimmie Molly, MD 05/17/12 [phone]

## 2012-05-26 ENCOUNTER — Telehealth: Payer: Self-pay | Admitting: Emergency Medicine

## 2012-05-26 NOTE — Telephone Encounter (Signed)
Patient is calling to discuss something she has found growing on her body.

## 2012-05-26 NOTE — Telephone Encounter (Signed)
Called patient and she thinks she has hemorrhoids so I scheduled her an appointment to come in and see Dr Elwyn Reach.Busick, Rodena Medin

## 2012-06-05 ENCOUNTER — Ambulatory Visit (INDEPENDENT_AMBULATORY_CARE_PROVIDER_SITE_OTHER): Payer: BC Managed Care – PPO | Admitting: Emergency Medicine

## 2012-06-05 ENCOUNTER — Encounter: Payer: Self-pay | Admitting: Emergency Medicine

## 2012-06-05 VITALS — BP 138/86 | HR 88 | Ht 59.5 in | Wt 162.0 lb

## 2012-06-05 DIAGNOSIS — K648 Other hemorrhoids: Secondary | ICD-10-CM | POA: Insufficient documentation

## 2012-06-05 NOTE — Assessment & Plan Note (Signed)
Likely small, asymptomatic internal hemorrhoid.  No large mass or lesion palpated on rectal exam.  With family history of colon cancer diagnosed in late 77s, will send for early screening.

## 2012-06-05 NOTE — Progress Notes (Signed)
  Subjective:    Patient ID: Samantha Key, female    DOB: [birth year], 46 y.o.   MRN: [id number]  HPI LAZETTE ESTALA is here for a SDA for anal bump.  She states that she has a bump on the inside of her anus.  This has been present for a year.  It sometimes gets a little bigger then shrinks down again.  No pain with BMs, no blood in stool or rectal bleeding.  Denies abdominal pain, but does get a "flushing" sensation in her abdomen periodically.  Reports stool is not hard and no straining with BMs.  I have reviewed and updated the following as appropriate: allergies, current medications, past family history and problem list FHx: colon cancer in mom diagnosed in late 19s. SHx: never smoker   Review of Systems See HPI    Objective:   Physical Exam BP 138/86  Pulse 88  Ht 4' 11.5" (1.511 m)  Wt 162 lb (73.483 kg)  BMI 32.17 kg/m2  LMP 05/23/2012 Gen: alert, cooperative, NAD HEENT: AT/[place], sclera white, MMM Abd: +BS, soft, NTND, no organomegaly, no masses appreciated Rectal: good tone; small non tender, non-erythematous bump on posterior aspect of anus; no internal lesions appreciated; stool in rectal vault      Assessment & Plan:

## 2012-06-05 NOTE — Patient Instructions (Signed)
It was nice to see you again. You may have a small hemorrhoid.  As long as it is not causing any pain or bleeding, we do not need to do anything about it. I would like you to get a screening colonoscopy since your mother was diagnosed with colon cancer in her late 77s. Follow up with me yearly for a physical exam or as needed.

## 2012-06-15 ENCOUNTER — Encounter: Payer: Self-pay | Admitting: Internal Medicine

## 2012-07-21 ENCOUNTER — Ambulatory Visit (AMBULATORY_SURGERY_CENTER): Payer: BC Managed Care – PPO | Admitting: *Deleted

## 2012-07-21 ENCOUNTER — Encounter: Payer: Self-pay | Admitting: Internal Medicine

## 2012-07-21 VITALS — Ht 60.0 in | Wt 166.2 lb

## 2012-07-21 DIAGNOSIS — Z800 Family history of malignant neoplasm of digestive organs: Secondary | ICD-10-CM

## 2012-07-21 DIAGNOSIS — Z1211 Encounter for screening for malignant neoplasm of colon: Secondary | ICD-10-CM

## 2012-07-21 MED ORDER — NA SULFATE-K SULFATE-MG SULF 17.5-3.13-1.6 GM/177ML PO SOLN: ORAL | Status: DC

## 2012-08-05 ENCOUNTER — Encounter: Payer: BC Managed Care – PPO | Admitting: Internal Medicine

## 2012-08-11 ENCOUNTER — Ambulatory Visit (AMBULATORY_SURGERY_CENTER): Payer: BC Managed Care – PPO | Admitting: Internal Medicine

## 2012-08-11 ENCOUNTER — Encounter: Payer: Self-pay | Admitting: Internal Medicine

## 2012-08-11 VITALS — BP 124/71 | HR 74 | Temp 98.2°F | Resp 12 | Ht 59.0 in | Wt 162.0 lb

## 2012-08-11 DIAGNOSIS — D126 Benign neoplasm of colon, unspecified: Secondary | ICD-10-CM

## 2012-08-11 DIAGNOSIS — Z800 Family history of malignant neoplasm of digestive organs: Secondary | ICD-10-CM

## 2012-08-11 DIAGNOSIS — K648 Other hemorrhoids: Secondary | ICD-10-CM

## 2012-08-11 DIAGNOSIS — Z1211 Encounter for screening for malignant neoplasm of colon: Secondary | ICD-10-CM

## 2012-08-11 MED ORDER — SODIUM CHLORIDE 0.9 % IV SOLN: 500.0000 mL | INTRAVENOUS | Status: DC

## 2012-08-11 NOTE — Progress Notes (Signed)
Patient did not experience any of the following events: a burn prior to discharge; a fall within the facility; wrong site/side/patient/procedure/implant event; or a hospital transfer or hospital admission upon discharge from the facility. (G8907) Patient did not have preoperative order for IV antibiotic SSI prophylaxis. (G8918)  

## 2012-08-11 NOTE — Progress Notes (Signed)
Propofol given over incremental dosages 

## 2012-08-11 NOTE — Progress Notes (Signed)
The pt has several reddish bruise markes where the blood pressure cuff was.  She states she has very sensitive skin.  She said if did not hurt. Maw

## 2012-08-11 NOTE — Progress Notes (Signed)
No complaints noted in the recovery room. Maw   

## 2012-08-11 NOTE — Patient Instructions (Addendum)
I removed one very small polyp. I think it is benign but will get it analyzed and let you know by mail. Think your next routine colonoscopy will be in 5 years - letter will let you know.  You do have internal hemorrhoids.  Thank you for choosing me and [facility].  Iva Boop, MD, [facility]    Handouts were given to your care partner on polyps, hemorrhoids and high fiber diet.  You may resume you current medications today.  Please call if any questions or concerns.   YOU HAD AN ENDOSCOPIC PROCEDURE TODAY AT THE [facility]: Refer to the procedure report that was given to you for any specific questions about what was found during the examination.  If the procedure report does not answer your questions, please call your gastroenterologist to clarify.  If you requested that your care partner not be given the details of your procedure findings, then the procedure report has been included in a sealed envelope for you to review at your convenience later.  YOU SHOULD EXPECT: Some feelings of bloating in the abdomen. Passage of more gas than usual.  Walking can help get rid of the air that was put into your GI tract during the procedure and reduce the bloating. If you had a lower endoscopy (such as a colonoscopy or flexible sigmoidoscopy) you may notice spotting of blood in your stool or on the toilet paper. If you underwent a bowel prep for your procedure, then you may not have a normal bowel movement for a few days.  DIET: Your first meal following the procedure should be a light meal and then it is ok to progress to your normal diet.  A half-sandwich or bowl of soup is an example of a good first meal.  Heavy or fried foods are harder to digest and may make you feel nauseous or bloated.  Likewise meals heavy in dairy and vegetables can cause extra gas to form and this can also increase the bloating.  Drink plenty of fluids but you should avoid alcoholic beverages for 24  hours.  ACTIVITY: Your care partner should take you home directly after the procedure.  You should plan to take it easy, moving slowly for the rest of the day.  You can resume normal activity the day after the procedure however you should NOT DRIVE or use heavy machinery for 24 hours (because of the sedation medicines used during the test).    SYMPTOMS TO REPORT IMMEDIATELY: A gastroenterologist can be reached at any hour.  During normal business hours, 8:30 AM to 5:00 PM Monday through Friday, call [phone].  After hours and on weekends, please call the GI answering service at [phone] who will take a message and have the physician on call contact you.   Following lower endoscopy (colonoscopy or flexible sigmoidoscopy):  Excessive amounts of blood in the stool  Significant tenderness or worsening of abdominal pains  Swelling of the abdomen that is new, acute  Fever of 100F or higher    FOLLOW UP: If any biopsies were taken you will be contacted by phone or by letter within the next 1-3 weeks.  Call your gastroenterologist if you have not heard about the biopsies in 3 weeks.  Our staff will call the home number listed on your records the next business day following your procedure to check on you and address any questions or concerns that you may have at that time regarding the information given to you  following your procedure. This is a courtesy call and so if there is no answer at the home number and we have not heard from you through the emergency physician on call, we will assume that you have returned to your regular daily activities without incident.  SIGNATURES/CONFIDENTIALITY: You and/or your care partner have signed paperwork which will be entered into your electronic medical record.  These signatures attest to the fact that that the information above on your After Visit Summary has been reviewed and is understood.  Full responsibility of the confidentiality of this  discharge information lies with you and/or your care-partner.

## 2012-08-11 NOTE — Op Note (Signed)
[facility] [address] [place], [address]   COLONOSCOPY PROCEDURE REPORT  PATIENT: Samantha Key, Samantha Key  MR#: [id number] BIRTHDATE: [birth year] , 46  yrs. old GENDER: Female ENDOSCOPIST: Iva Boop, MD, [facility] REFERRED BY:   Despina Hick, MD PROCEDURE DATE:  08/11/2012 PROCEDURE:   Colonoscopy with snare polypectomy ASA CLASS:   Class I INDICATIONS:elevated risk screening and Patient's immediate family history of colon cancer. MEDICATIONS: MAC sedation, administered by CRNA, These medications were titrated to patient response per physician's verbal order, and propofol (Diprivan) 200mg  IV  DESCRIPTION OF PROCEDURE:   After the risks benefits and alternatives of the procedure were thoroughly explained, informed consent was obtained.  A digital rectal exam revealed no abnormalities of the rectum.   The LB [id number]  endoscope was introduced through the anus and advanced to the cecum, which was identified by both the appendix and ileocecal valve. No adverse events experienced.   The quality of the prep was Suprep excellent The instrument was then slowly withdrawn as the colon was fully examined.      COLON FINDINGS: A polypoid shaped pedunculated polyp measuring 5 mm in size was found in the descending colon.  A polypectomy was performed with a cold snare.  The resection was complete and the polyp tissue was completely retrieved.   Small internal hemorrhoids were found.   The colon mucosa was otherwise normal.   A right colon retroflexion was performed.  Retroflexed views revealed internal hemorrhoids. The time to cecum=5 minutes 0 seconds. Withdrawal time=8 minutes 16 seconds.  The scope was withdrawn and the procedure completed. COMPLICATIONS: There were no complications.  ENDOSCOPIC IMPRESSION: 1.   Pedunculated polyp measuring 5 mm in size was found in the descending colon; polypectomy was performed with a cold snare 2.   Small internal  hemorrhoids 3.   The colon mucosa was otherwise normal - excellent prep  RECOMMENDATIONS: Timing of repeat colonoscopy will be determined by pathology findings.   eSigned:  Iva Boop, MD, [facility] 08/11/2012 9:34 AM   cc: The Patient  and Despina Hick, MD

## 2012-08-11 NOTE — Progress Notes (Signed)
Called to room to assist during endoscopic procedure.  Patient ID and intended procedure confirmed with present staff. Received instructions for my participation in the procedure from the performing physician.  

## 2012-08-12 ENCOUNTER — Telehealth: Payer: Self-pay

## 2012-08-12 NOTE — Telephone Encounter (Signed)
  Follow up Call-  Call back number 08/11/2012  Post procedure Call Back phone  # [phone] OR [phone]  Permission to leave phone message Yes     Patient questions:  Do you have a fever, pain , or abdominal swelling? no Pain Score  0 *  Have you tolerated food without any problems? yes  Have you been able to return to your normal activities? yes  Do you have any questions about your discharge instructions: Diet   no Medications  no Follow up visit  no  Do you have questions or concerns about your Care? no  Actions: * If pain score is 4 or above: No action needed, pain <4.  No problems per the pt. Maw

## 2012-08-18 ENCOUNTER — Encounter: Payer: Self-pay | Admitting: Internal Medicine

## 2012-08-18 DIAGNOSIS — Z860100 Personal history of colon polyps, unspecified: Secondary | ICD-10-CM

## 2012-08-18 HISTORY — DX: Personal history of colon polyps, unspecified: Z86.0100

## 2012-08-18 HISTORY — DX: Personal history of colonic polyps: Z86.010

## 2012-08-18 NOTE — Progress Notes (Signed)
Quick Note:  5 mm adenoma Repeat colon 07/2017 ______

## 2012-11-23 ENCOUNTER — Other Ambulatory Visit: Payer: Self-pay | Admitting: Emergency Medicine

## 2012-11-23 DIAGNOSIS — Z1231 Encounter for screening mammogram for malignant neoplasm of breast: Secondary | ICD-10-CM

## 2012-11-27 ENCOUNTER — Ambulatory Visit (HOSPITAL_COMMUNITY)
Admission: RE | Admit: 2012-11-27 | Discharge: 2012-11-27 | Disposition: A | Discharge status: Home / Self Care | Payer: BC Managed Care – PPO | Source: Ambulatory Visit | Attending: Family Medicine | Admitting: Family Medicine

## 2012-11-27 DIAGNOSIS — Z1231 Encounter for screening mammogram for malignant neoplasm of breast: Secondary | ICD-10-CM

## 2012-12-22 ENCOUNTER — Encounter: Payer: Self-pay | Admitting: Emergency Medicine

## 2012-12-22 ENCOUNTER — Ambulatory Visit (INDEPENDENT_AMBULATORY_CARE_PROVIDER_SITE_OTHER): Payer: BC Managed Care – PPO | Admitting: Emergency Medicine

## 2012-12-22 VITALS — BP 136/92 | HR 99 | Ht <= 58 in | Wt 163.0 lb

## 2012-12-22 DIAGNOSIS — Z0000 Encounter for general adult medical examination without abnormal findings: Secondary | ICD-10-CM

## 2012-12-22 LAB — CBC
Hemoglobin: 12.4 g/dL (ref 12.0–15.0)
MCH: 29.6 pg (ref 26.0–34.0)
MCV: 88.8 fL (ref 78.0–100.0)
RBC: 4.19 MIL/uL (ref 3.87–5.11)

## 2012-12-22 LAB — COMPREHENSIVE METABOLIC PANEL
CO2: 26 mEq/L (ref 19–32)
Calcium: 9.5 mg/dL (ref 8.4–10.5)
Chloride: 103 mEq/L (ref 96–112)
Creat: 0.64 mg/dL (ref 0.50–1.10)
Glucose, Bld: 95 mg/dL (ref 70–99)
Total Bilirubin: 0.4 mg/dL (ref 0.3–1.2)
Total Protein: 8 g/dL (ref 6.0–8.3)

## 2012-12-22 LAB — LIPID PANEL
Cholesterol: 174 mg/dL (ref 0–200)
Total CHOL/HDL Ratio: 2.8 Ratio
Triglycerides: 56 mg/dL (ref <150)
VLDL: 11 mg/dL (ref 0–40)

## 2012-12-22 LAB — TSH: TSH: 1.117 u[IU]/mL (ref 0.350–4.500)

## 2012-12-22 NOTE — Patient Instructions (Addendum)
It was nice to see you! Please continue the multivitamin and vitamin D supplement. Everything looks good today. I will call if anything comes back on your labs, otherwise you will get a letter with the results.  Things to do to Keep yourself Healthy - Exercise at least 30-45 minutes a day,  3-4 days a week.  - Eat a low-fat diet with lots of fruits and vegetables, up to 7-9 servings per day. - Seatbelts can save your life. Wear them always. - Smoke detectors on every level of your home, check batteries every year. - Eye Doctor - have an eye exam every 1-2 years - Safe sex - if you may be exposed to STDs, use a condom. - Alcohol If you drink, do it moderately,less than 2 drinks per day. - Health Care Power of Attorney.  Choose someone to speak for you if you are not able. - Depression is common in our stressful world.If you're feeling down or losing interest in things you normally enjoy, please come in for a visit. - Violence - If anyone is threatening or hurting you, please call immediately.  Follow up in 1 year or sooner if needed.

## 2012-12-22 NOTE — Progress Notes (Signed)
  Subjective:    Patient ID: Samantha Key, female    DOB: 07/03/1966, 47 y.o.   MRN: [id number]  HPI Samantha Key is here for an annual exam.  I have reviewed and updated the following as appropriate: allergies, current medications, past family history, past medical history, past social history, past surgical history and problem list - reports intermittent sensation of gas bubble in chest/throat; occurs 1-2 times per week; resolves spontaneously in minutes; no pain, nausea or vomiting - had colonoscopy last year with 2 polyps, non-cancerous - sexual active with condoms for birth control - still having regular periods, but they fluctuate in flow; does have some hot flashes SHx: never smoker  Review of Systems Patient Information Form: Screening and ROS  Do you feel safe in relationships? yes PHQ-2:negative  Review of Symptoms  General:  Negative for nexplained weight loss, fever Skin: Negative for new or changing mole, sore that won't heal HEENT: Negative for trouble hearing, trouble seeing, + ringing in ears, mouth sores, hoarseness, change in voice, dysphagia. CV:  Negative for chest pain, dyspnea, edema, palpitations Resp: Negative for cough, dyspnea, hemoptysis GI: Negative for nausea, vomiting, diarrhea, constipation, abdominal pain, melena, hematochezia. GU: Negative for dysuria, incontinence, urinary hesitance, hematuria, vaginal or penile discharge, polyuria, sexual difficulty, lumps in testicle or breasts MSK: Negative for + muscle cramps or aches, joint pain or swelling Neuro: Negative for headaches, weakness, numbness, dizziness, passing out/fainting Psych: Negative for depression, anxiety, memory problems      Objective:   Physical Exam BP 136/92  Pulse 99  Ht 4\' 8"  (1.422 m)  Wt 163 lb (73.936 kg)  BMI 36.56 kg/m2  LMP 11/18/2012 Gen: alert, cooperative, NAD HEENT: AT/[place], sclera white, PERRL, MMM, no pharyngeal erythema or edema Neck: supple, no LAD,  thyroid normal CV: RRR, no murmurs Pulm: CTAB, no wheezes or rales Abd: +BS, soft, NTND Pelvic: normal external genitalia, normal vagina, mild cystocele present, normal physiologic discharge, cervix normal; bimanual exam normal Ext: no edema, 2+ DP pulses bilaterally      Assessment & Plan:

## 2012-12-22 NOTE — Assessment & Plan Note (Addendum)
Doing well.  Up to date on health maintenance.  Will check screening lab work - cbc, cmp, tsh, lipids. She complains of a gas feeling in her chest and throat periodically.  This may be reflux, but patient is not bothered by this and does not want to start any medication. Blood pressure borderline today.  Will continue to monitor. Follow up in 1 year or sooner as needed.

## 2012-12-23 ENCOUNTER — Encounter: Payer: Self-pay | Admitting: Emergency Medicine

## 2013-01-11 ENCOUNTER — Encounter: Payer: BC Managed Care – PPO | Admitting: Emergency Medicine

## 2013-02-24 ENCOUNTER — Encounter: Payer: Self-pay | Admitting: Emergency Medicine

## 2013-02-24 ENCOUNTER — Ambulatory Visit (INDEPENDENT_AMBULATORY_CARE_PROVIDER_SITE_OTHER): Payer: BC Managed Care – PPO | Admitting: Emergency Medicine

## 2013-02-24 VITALS — BP 131/89 | HR 94 | Temp 98.5°F | Ht 59.0 in | Wt 162.0 lb

## 2013-02-24 DIAGNOSIS — J069 Acute upper respiratory infection, unspecified: Secondary | ICD-10-CM

## 2013-02-24 NOTE — Patient Instructions (Addendum)
It was nice to see you! I think you have gotten one cold after another. There is no sign of pneumonia or sinus infection. Continue the tea with honey.  You can take Robitussin DM if it is helping. I would recommend getting some nasal saline spray and using it 3 times a day to help prevent sinus infection. If you start having fevers or pus from your nose, please come back.

## 2013-02-24 NOTE — Assessment & Plan Note (Signed)
Suspect repeat infections given her work. No fevers or exam findings to suggest pneumonia or bacterial sinus infection. Symptomatic treatment with OTC cough syrup, tea and honey, and nasal saline spray. Reviewed reasons to return as in AVS.

## 2013-02-24 NOTE — Progress Notes (Signed)
  Subjective:    Patient ID: Samantha Key, female    DOB: 10/28/1965, 47 y.o.   MRN: [id number]  HPI Samantha Key is here for URI symptoms.  She reports mild cough, chest congestion, rhinorrhea, sneezing, intermittent hoarseness for the last 3 weeks.  She works as a Conservation officer, nature in [employer].  States it did seem to get better at one point, but then got worse again.  No fevers or purulent nasal discharge.  Denies itchy or watery eyes.  No sinus pressure.  Has been taking robitussin and mucinex with minimal improvement.  Report good PO intake without nausea or vomiting.  No abdominal pain or diarrhea.  I have reviewed and updated the following as appropriate: allergies and current medications SHx: non smoker  Review of Systems See HPI    Objective:   Physical Exam BP 131/89  Pulse 94  Temp(Src) 98.5 F (36.9 C) (Oral)  Ht 4\' 11"  (1.499 m)  Wt 162 lb (73.483 kg)  BMI 32.7 kg/m2 Gen: alert, cooperative, NAD HEENT: AT/[place], sclera white, PERRL, nasal turbinates mildly edematous but without erythema or purulent drainage, MMM, no pharyngeal erythema or exudate, no sinus tenderness Neck: supple, no LAD CV: RRR, no murmurs Pulm: CTAB, no wheezes or rales      Assessment & Plan:

## 2013-11-17 ENCOUNTER — Other Ambulatory Visit: Payer: Self-pay | Admitting: Emergency Medicine

## 2013-11-17 DIAGNOSIS — Z1231 Encounter for screening mammogram for malignant neoplasm of breast: Secondary | ICD-10-CM

## 2013-12-03 ENCOUNTER — Ambulatory Visit (HOSPITAL_COMMUNITY)
Admission: RE | Admit: 2013-12-03 | Discharge: 2013-12-03 | Disposition: A | Discharge status: Home / Self Care | Payer: No Typology Code available for payment source | Source: Ambulatory Visit | Attending: Family Medicine | Admitting: Family Medicine

## 2013-12-03 DIAGNOSIS — Z1231 Encounter for screening mammogram for malignant neoplasm of breast: Secondary | ICD-10-CM | POA: Insufficient documentation

## 2013-12-29 ENCOUNTER — Ambulatory Visit: Payer: BC Managed Care – PPO | Admitting: Emergency Medicine

## 2014-01-19 ENCOUNTER — Ambulatory Visit (INDEPENDENT_AMBULATORY_CARE_PROVIDER_SITE_OTHER): Payer: PRIVATE HEALTH INSURANCE | Admitting: Emergency Medicine

## 2014-01-19 ENCOUNTER — Encounter: Payer: Self-pay | Admitting: Emergency Medicine

## 2014-01-19 VITALS — BP 125/85 | HR 90 | Ht 59.0 in | Wt 160.0 lb

## 2014-01-19 DIAGNOSIS — Z0000 Encounter for general adult medical examination without abnormal findings: Secondary | ICD-10-CM

## 2014-01-19 LAB — POCT GLYCOSYLATED HEMOGLOBIN (HGB A1C): Hemoglobin A1C: 5.3

## 2014-01-19 LAB — CBC
HCT: 36.5 % (ref 36.0–46.0)
Hemoglobin: 12.5 g/dL (ref 12.0–15.0)
MCH: 30.3 pg (ref 26.0–34.0)
MCHC: 34.2 g/dL (ref 30.0–36.0)
MCV: 88.4 fL (ref 78.0–100.0)
PLATELETS: 327 10*3/uL (ref 150–400)
RBC: 4.13 MIL/uL (ref 3.87–5.11)
RDW: 13.3 % (ref 11.5–15.5)
WBC: 5.4 10*3/uL (ref 4.0–10.5)

## 2014-01-19 LAB — COMPLETE METABOLIC PANEL WITH GFR
ALT: 14 U/L (ref 0–35)
AST: 13 U/L (ref 0–37)
Albumin: 3.9 g/dL (ref 3.5–5.2)
Alkaline Phosphatase: 52 U/L (ref 39–117)
BUN: 13 mg/dL (ref 6–23)
CALCIUM: 9 mg/dL (ref 8.4–10.5)
CHLORIDE: 103 meq/L (ref 96–112)
CO2: 24 meq/L (ref 19–32)
CREATININE: 0.68 mg/dL (ref 0.50–1.10)
GLUCOSE: 97 mg/dL (ref 70–99)
Potassium: 4.2 mEq/L (ref 3.5–5.3)
Sodium: 138 mEq/L (ref 135–145)
Total Bilirubin: 0.4 mg/dL (ref 0.2–1.2)
Total Protein: 7.1 g/dL (ref 6.0–8.3)

## 2014-01-19 LAB — TSH: TSH: 1.108 u[IU]/mL (ref 0.350–4.500)

## 2014-01-19 NOTE — Assessment & Plan Note (Signed)
Blood work today - cmp, tsh, cbc, a1c. Patient to followup for pap smear in the next month.

## 2014-01-19 NOTE — Progress Notes (Signed)
   Subjective:    Patient ID: Samantha Key, female    DOB: [birth year], 48 y.o.   MRN: [id number]  HPI QUEEN ABBETT is here for annual exam.  I have reviewed and updated the following as appropriate: allergies, current medications, past family history, past medical history, past social history, past surgical history and problem list SHx: never smoker PHMx: h/o colon polyp f/u colonoscopy in 2018  She has no acute concerns today.  Does mention some intermittent chest pains.  Describes as sharp, shooting pains across her chest to the left breast.  No associated shortness of breath, dizziness or diaphoresis.  No association with exertion.  Also reports an occasional fluttering sensation between her breasts that last for a few seconds; this is not association with the chest pain.  Health Maintenance: She would like a pap smear this year.  Discussed new guidelines with patient but she would prefer to stay on an every other year cycle.  She will return in a few weeks when she is no longer on her menses.  Up to date on colonoscopy and mammogram.    Current Outpatient Prescriptions on File Prior to Visit  Medication Sig Dispense Refill  . cholecalciferol (VITAMIN D) 400 UNITS TABS Take by mouth.      . Multiple Vitamin (MULTIVITAMIN) capsule Take 1 capsule by mouth daily.       No current facility-administered medications on file prior to visit.   Review of Systems Patient Information Form: Screening and ROS  Do you feel safe in relationships? yes PHQ-2:negative  Review of Symptoms  General:  Negative for nexplained weight loss, fever Skin: Negative for new or changing mole, sore that won't heal HEENT: Negative for trouble hearing, trouble seeing, ringing in ears, mouth sores, hoarseness, change in voice, dysphagia. CV:  Negative for +chest pain, dyspnea, edema, palpitations Resp: Negative for cough, dyspnea, hemoptysis GI: Negative for nausea, vomiting, diarrhea, constipation,  abdominal pain, melena, hematochezia. GU: Negative for dysuria, incontinence, urinary hesitance, hematuria, vaginal or penile discharge, polyuria, sexual difficulty, lumps in testicle or breasts MSK: Negative for muscle cramps or aches, joint pain or swelling Neuro: Negative for headaches, weakness, numbness, dizziness, passing out/fainting Psych: Negative for depression, anxiety, memory problems      Objective:   Physical Exam BP 125/85  Pulse 90  Ht 4\' 11"  (1.499 m)  Wt 160 lb (72.576 kg)  BMI 32.30 kg/m2  LMP 01/19/2014 Gen: alert, cooperative, NAD HEENT: AT/[place], sclera white, MMM, TMs normal bilaterally Neck: supple, no LAD, no bruits CV: RRR, no murmurs Pulm: CTAB, no wheezes or rales Abd: +BS, soft, NTND Ext: no edema, 2+ DP pulses bilaterally Neuro: alert, oriented, PERRL, 5/5 strength in all extremities     Assessment & Plan:

## 2014-01-20 ENCOUNTER — Encounter: Payer: Self-pay | Admitting: Emergency Medicine

## 2014-01-31 DIAGNOSIS — R87619 Unspecified abnormal cytological findings in specimens from cervix uteri: Secondary | ICD-10-CM

## 2014-01-31 HISTORY — DX: Unspecified abnormal cytological findings in specimens from cervix uteri: R87.619

## 2014-02-01 ENCOUNTER — Other Ambulatory Visit (HOSPITAL_COMMUNITY)
Admission: RE | Admit: 2014-02-01 | Discharge: 2014-02-01 | Disposition: A | Discharge status: Home / Self Care | Payer: No Typology Code available for payment source | Source: Ambulatory Visit | Attending: Emergency Medicine | Admitting: Emergency Medicine

## 2014-02-01 ENCOUNTER — Ambulatory Visit (INDEPENDENT_AMBULATORY_CARE_PROVIDER_SITE_OTHER): Payer: PRIVATE HEALTH INSURANCE | Admitting: Emergency Medicine

## 2014-02-01 ENCOUNTER — Encounter: Payer: Self-pay | Admitting: Emergency Medicine

## 2014-02-01 VITALS — BP 132/82 | HR 94 | Temp 98.7°F | Ht 59.0 in | Wt 159.8 lb

## 2014-02-01 DIAGNOSIS — Z124 Encounter for screening for malignant neoplasm of cervix: Secondary | ICD-10-CM

## 2014-02-01 DIAGNOSIS — Z1151 Encounter for screening for human papillomavirus (HPV): Secondary | ICD-10-CM | POA: Insufficient documentation

## 2014-02-01 NOTE — Patient Instructions (Signed)
It was nice to see you!  I will send you a letter with the results of your pap smear.  If something is wrong, I will call you.  Follow up in 1 year for annual exam or sooner as needed.

## 2014-02-01 NOTE — Progress Notes (Signed)
   Subjective:    Patient ID: Samantha Key, female    DOB: Jun 20, 1966, 48 y.o.   MRN: [id number]  HPI LENOLA LOCKNER is here for pap smear.  Patient here for routine exam. Current complaints: none.    Gynecologic History Patient's last menstrual period was 01/19/2014. Contraception: condoms Last Pap: 10/2011. Results were: normal Last mammogram: 11/2013. Results were: normal   Current Outpatient Prescriptions on File Prior to Visit  Medication Sig Dispense Refill  . cholecalciferol (VITAMIN D) 400 UNITS TABS Take by mouth.      . Multiple Vitamin (MULTIVITAMIN) capsule Take 1 capsule by mouth daily.       No current facility-administered medications on file prior to visit.    I have reviewed and updated the following as appropriate: allergies, current medications, past family history, past medical history, past social history, past surgical history and problem list SHx: never smoker  Health Maintenance: up to date on pap, mammogram and colonoscopy   Review of Systems See HPI    Objective:   Physical Exam BP 132/82  Pulse 94  Temp(Src) 98.7 F (37.1 C) (Oral)  Ht 4\' 11"  (1.499 m)  Wt 159 lb 12.8 oz (72.485 kg)  BMI 32.26 kg/m2  LMP 01/19/2014 Gen: alert, cooperative, NAD Pelvic: normal external genitalia; normal vagina; cervix normal with a nabothian cyst at 9 o'clock; 8 week size nodular uterus; no adnexal masses      Assessment & Plan:

## 2014-02-03 LAB — CYTOLOGY - PAP

## 2014-02-07 ENCOUNTER — Telehealth: Payer: Self-pay | Admitting: Emergency Medicine

## 2014-02-07 DIAGNOSIS — R87619 Unspecified abnormal cytological findings in specimens from cervix uteri: Secondary | ICD-10-CM

## 2014-02-07 NOTE — Telephone Encounter (Signed)
I called and left a voicemail for the patient.  I have requested that she leave several times she is available in the next few days so I can call her and discuss the results of her pap smear and labs from last week.  I would prefer to discuss the results with her rather than have one of the RNs or CMAs give her the results.

## 2014-02-08 NOTE — Telephone Encounter (Signed)
Spoke with patient.  Informed her of the atypical glandular cells on pap smear.  Recommendation is for colposcopy and endometrial biopsy.  Given potential complexity of followup, we decided to refer her to a OB/GYN.  Referral placed.  She should get a call to set up the appt in the next 1-2 weeks.  Would like to get her scheduled in the next 1-2 months.

## 2014-02-08 NOTE — Telephone Encounter (Signed)
Pt is at [phone] until 12 noon today After 12 today, cell phone [phone]

## 2014-02-08 NOTE — Addendum Note (Signed)
Addended by: Melony Overly on: 02/08/2014 12:14 PM   Modules accepted: Orders

## 2014-02-10 ENCOUNTER — Telehealth: Payer: Self-pay | Admitting: Obstetrics and Gynecology

## 2014-02-10 NOTE — Telephone Encounter (Signed)
LMTCB to schedule a new patient doctor referral.

## 2014-02-17 NOTE — Telephone Encounter (Signed)
Scheduled

## 2014-02-23 ENCOUNTER — Encounter: Payer: Self-pay | Admitting: Obstetrics and Gynecology

## 2014-02-23 ENCOUNTER — Ambulatory Visit (INDEPENDENT_AMBULATORY_CARE_PROVIDER_SITE_OTHER): Payer: PRIVATE HEALTH INSURANCE | Admitting: Obstetrics and Gynecology

## 2014-02-23 VITALS — BP 120/80 | HR 84 | Resp 16 | Ht 59.0 in | Wt 162.8 lb

## 2014-02-23 DIAGNOSIS — R87619 Unspecified abnormal cytological findings in specimens from cervix uteri: Secondary | ICD-10-CM

## 2014-02-23 DIAGNOSIS — N921 Excessive and frequent menstruation with irregular cycle: Secondary | ICD-10-CM

## 2014-02-23 DIAGNOSIS — N852 Hypertrophy of uterus: Secondary | ICD-10-CM

## 2014-02-23 NOTE — Progress Notes (Signed)
Patient ID: Samantha Key, female   DOB: 24-Dec-1965, 48 y.o.   MRN: [id number] GYNECOLOGY VISIT  PCP:  Maryruth Eve, MD  Referring provider:   HPI: 48 y.o.   Married  [place] American  female   G1P1001 with Patient's last menstrual period was 02/03/2014.   here for   Abnormal pap smear. AGUS pap, favor endocervical origin; negative HR HPV - 01/31/14 - [facility].  This was the first abnormal pap ever.  Menses can come twice a month. Can stop at about day nine.   Occurring for a couple of months. No pain with menses.  Bleeding is not heavy.  Not doing any hormonal birth control. No prior GYN surgery.  History of trichomonas treated 4 years ago. Treated. All other testing for STDs were negative. No change in partners.   No family history of breast, ovarian, uterine CA. Mother with colon CA. Patient with history of colonic polyp.  GYNECOLOGIC HISTORY: Patient's last menstrual period was 02/03/2014. Sexually active:  yes Partner preference: female Contraception:   condoms Menopausal hormone therapy: n/a DES exposure:   no Blood transfusions:   no Sexually transmitted diseases:   no GYN procedures and prior surgeries:  none Last mammogram:  12-03-13 wnl:[facility]               Last pap and high risk HPV testing:  02-01-14 AGUS:neg HR HPV--[facility]  History of abnormal pap smear: 02-01-14  AGUS:neg HR HPV     OB History   Grav Para Term Preterm Abortions TAB SAB Ect Mult Living   1 1 1       1        LIFESTYLE: Exercise:   no          Tobacco:   no Alcohol:   no Drug use:  no  Patient Active Problem List   Diagnosis Date Noted  . Personal history of adenomatous colonic polyp and gamily hx colon cancer mother 08/18/2012  . Internal hemorrhoid 06/05/2012  . Photodermatitis due to sun 11/04/2011  . Annual physical exam 11/04/2011  . GLUCOSE INTOLERANCE 10/23/2006  . BACK PAIN, LOW 10/23/2006    Past Medical History  Diagnosis Date  . Personal history of  adenomatous colonic polyp 08/18/2012    07/2012 - 5 mm adenoma    Past Surgical History  Procedure Laterality Date  . No prior surgery      Current Outpatient Prescriptions  Medication Sig Dispense Refill  . cholecalciferol (VITAMIN D) 400 UNITS TABS Take by mouth.      . Multiple Vitamin (MULTIVITAMIN) capsule Take 1 capsule by mouth daily.       No current facility-administered medications for this visit.     ALLERGIES: Review of patient's allergies indicates no known allergies.  Family History  Problem Relation Age of Onset  . Diabetes Mother   . Hypertension Mother   . Cancer Mother     Colon, diagnosed late 86s  . Colon cancer Mother 73  . Cancer Father   . Stomach cancer Father 35  . Diabetes Sister   . Hypertension Sister   . Seizures Brother   . Diabetes Brother     History   Social History  . Marital Status: Married    Spouse Name: N/A    Number of Children: N/A  . Years of Education: N/A   Occupational History  . Not on file.   Social History Main Topics  . Smoking status: Never Smoker   .  Smokeless tobacco: Never Used  . Alcohol Use: No  . Drug Use: No  . Sexual Activity: Yes    Partners: Male    Birth Control/ Protection: Condom   Other Topics Concern  . Not on file   Social History Narrative  . No narrative on file  From [place].  [place].   ROS:  Pertinent items are noted in HPI.  PHYSICAL EXAMINATION:    BP 120/80  Pulse 84  Resp 16  Ht 4\' 11"  (1.499 m)  Wt 162 lb 12.8 oz (73.846 kg)  BMI 32.86 kg/m2  LMP 02/03/2014   Wt Readings from Last 3 Encounters:  02/23/14 162 lb 12.8 oz (73.846 kg)  02/01/14 159 lb 12.8 oz (72.485 kg)  01/19/14 160 lb (72.576 kg)     Ht Readings from Last 3 Encounters:  02/23/14 4\' 11"  (1.499 m)  02/01/14 4\' 11"  (1.499 m)  01/19/14 4\' 11"  (1.499 m)    General appearance: alert, cooperative and appears stated age Head: Normocephalic, without obvious abnormality, atraumatic Neck: no  adenopathy, supple, symmetrical, trachea midline and thyroid not enlarged, symmetric, no tenderness/mass/nodules Lungs: clear to auscultation bilaterally Heart: regular rate and rhythm Abdomen: soft, non-tender; no masses,  no organomegaly Extremities: extremities normal, atraumatic, no cyanosis or edema Skin: Skin color, texture, turgor normal. No rashes or lesions Lymph nodes: Cervical, supraclavicular, and axillary nodes normal. No abnormal inguinal nodes palpated Neurologic: Grossly normal  Pelvic: External genitalia:  no lesions              Urethra:  normal appearing urethra with no masses, tenderness or lesions              Bartholins and Skenes: normal                 Vagina: normal appearing vagina with normal color and discharge, no lesions              Cervix: normal appearance.  Nabothian cyst at 7 o'clock.                  Bimanual Exam:  Uterus:  uterus is six week size, shape, consistency and nontender                                      Adnexa: normal adnexa in size, nontender and no masses                                      Rectovaginal: Confirms                                      Anus:  normal sphincter tone, no lesions  ASSESSMENT  AGUS pap.  HR HPV negative.  Abnormal uterine bleeding.  Slightly enlarged uterus.  Possible fibroid.  PLAN  Discussion regarding AGUS pap and abnormal uterine bleeding.  Will return for two separate visits:  One colposcopy with endometrial biopsy, one for pelvic ultrasound.  Patient understands the importance of follow up.   An After Visit Summary was printed and given to the patient.  45 minutes face to face time of which over 50% was spent in counseling.

## 2014-02-23 NOTE — Patient Instructions (Addendum)
Colposcopy Colposcopy is a procedure to examine your cervix and vagina, or the area around the outside of your vagina, for abnormalities or signs of disease. The procedure is done using a lighted microscope called a colposcope. Tissue samples may be collected during the colposcopy if your health care provider finds any unusual cells. A colposcopy may be done if a woman has:  An abnormal Pap test. A Pap test is a medical test done to evaluate cells that are on the surface of the cervix.  A Pap test result that is suggestive of human papillomavirus (HPV). This virus can cause genital warts and is linked to the development of cervical cancer.  A sore on her cervix and the results of a Pap test were normal.  Genital warts on the cervix or in or around the outside of the vagina.  A mother who took the drug diethylstilbestrol (DES) while pregnant.  Painful intercourse.  Vaginal bleeding, especially after sexual intercourse. LET [facility] CARE PROVIDER KNOW ABOUT:  Any allergies you have.  All medicines you are taking, including vitamins, herbs, eye drops, creams, and over-the-counter medicines.  Previous problems you or members of your family have had with the use of anesthetics.  Any blood disorders you have.  Previous surgeries you have had.  Medical conditions you have. RISKS AND COMPLICATIONS Generally, a colposcopy is a safe procedure. However, as with any procedure, complications can occur. Possible complications include:  Bleeding.  Infection.  Missed lesions. BEFORE THE PROCEDURE   Tell your health care provider if you have your menstrual period. A colposcopy typically is not done during menstruation.  For 24 hours before the colposcopy, do not:  Douche.  Use tampons.  Use medicines, creams, or suppositories in the vagina.  Have sexual intercourse. PROCEDURE  During the procedure, you will be lying on your back with your feet in foot rests (stirrups). A warm  metal or plastic instrument (speculum) will be placed in your vagina to keep it open and to allow the health care provider to see the cervix. The colposcope will be placed outside the vagina. It will be used to magnify and examine the cervix, vagina, and the area around the outside of the vagina. A small amount of liquid solution will be placed on the area that is to be viewed. This solution will make it easier to see the abnormal cells. Your health care provider will use tools to suck out mucus and cells from the canal of the cervix. Then he or she will record the location of the abnormal areas. If a biopsy is done during the procedure, a medicine will usually be given to numb the area (local anesthetic). You may feel mild pain or cramping while the biopsy is done. After the procedure, tissue samples collected during the biopsy will be sent to a lab for analysis. AFTER THE PROCEDURE  You will be given instructions on when to follow up with your health care provider for your test results. It is important to keep your appointment. Document Released: 11/02/2002 Document Revised: 04/14/2013 Document Reviewed: 03/11/2013 [facility] Patient Information 2015 [place], [place]. This information is not intended to replace advice given to you by your health care provider. Make sure you discuss any questions you have with your health care provider.  Endometrial Biopsy Endometrial biopsy is a procedure in which a tissue sample is taken from inside the uterus. The tissue sample is then looked at under a microscope to see if the tissue is normal or abnormal.  The endometrium is the lining of the uterus. This procedure helps determine where you are in your menstrual cycle and how hormone levels are affecting the lining of the uterus. This procedure may also be used to evaluate uterine bleeding or to diagnose endometrial cancer, tuberculosis, polyps, or inflammatory conditions.  LET [facility] CARE PROVIDER KNOW ABOUT:  Any  allergies you have.  All medicines you are taking, including vitamins, herbs, eye drops, creams, and over-the-counter medicines.  Previous problems you or members of your family have had with the use of anesthetics.  Any blood disorders you have.  Previous surgeries you have had.  Medical conditions you have.  Possibility of pregnancy. RISKS AND COMPLICATIONS Generally, this is a safe procedure. However, as with any procedure, complications can occur. Possible complications include:  Bleeding.  Pelvic infection.  Puncture of the uterine wall with the biopsy device (rare). BEFORE THE PROCEDURE   Keep a record of your menstrual cycles as directed by your health care provider. You may need to schedule your procedure for a specific time in your cycle.  You may want to bring a sanitary pad to wear home after the procedure.  Arrange for someone to drive you home after the procedure if you will be given a medicine to help you relax (sedative). PROCEDURE   You may be given a sedative to relax you.  You will lie on an exam table with your feet and legs supported as in a pelvic exam.  Your health care provider will insert an instrument (speculum) into your vagina to see your cervix.  Your cervix will be cleansed with an antiseptic solution. A medicine (local anesthetic) will be used to numb the cervix.  A forceps instrument (tenaculum) will be used to hold your cervix steady for the biopsy.  A thin, rodlike instrument (uterine sound) will be inserted through your cervix to determine the length of your uterus and the location where the biopsy sample will be removed.  A thin, flexible tube (catheter) will be inserted through your cervix and into the uterus. The catheter is used to collect the biopsy sample from your endometrial tissue.  The catheter and speculum will then be removed, and the tissue sample will be sent to a lab for examination. AFTER THE PROCEDURE  You will rest in  a recovery area until you are ready to go home.  You may have mild cramping and a small amount of vaginal bleeding for a few days after the procedure. This is normal.  Make sure you find out how to get your test results. Document Released: 12/13/2004 Document Revised: 04/14/2013 Document Reviewed: 01/27/2013 [facility] Patient Information 2015 [place], [place]. This information is not intended to replace advice given to you by your health care provider. Make sure you discuss any questions you have with your health care provider.  Abnormal Pap Test Information During a Pap test, the cells on the surface of your cervix are checked to see if they look normal, abnormal, or if they show signs of having been altered by a certain type of virus called human papillomavirus, or HPV. Cervical cells that have been affected by HPV are called dysplasia. Dysplasia is not cancer, but describes abnormal cells found on the surface of the cervix. Depending on the degree of dysplasia, some of the cells may be considered pre-cancerous and may turn into cancer over time if follow up with a caregiver is delayed.  WHAT DOES AN ABNORMAL PAP TEST MEAN? Having an abnormal pap test  does not mean that you have cancer. However, certain types of abnormal pap tests can be a sign that a person is at a higher risk of developing cancer. Your caregiver will want to do other tests to find out more about the abnormal cells. Your abnormal Pap test results could show:   Small and uncertain changes that should be carefully watched.   Cervical dysplasia that has caused mild changes and can be followed over time.  Cervical dysplasia that is more severe and needs to be followed and treated to ensure the problem goes away.  Cancer.  When severe cervical dysplasia is found and treated early, it rarely will grow into cancer.  WHAT WILL BE DONE ABOUT MY ABNORMAL PAP TEST?  A colposcopy may be needed. This is a procedure where your cervix  is examined using light and magnification.  A small tissue sample of your cervix (biopsy) may need to be removed and then examined. This is often performed if there are areas that appear infected.  A sample of cells from the cervical canal may be removed with either a small brush or scraping instrument (curette). Based on the results of the procedures above, some caregivers may recommend either cryotherapy of the cervix or a surgical LEEP where a portion of the cervix is removed. LEEP is short for "loop electrical excisional procedure." Rarely, a caregiver may recommend a cone biopsy.This is a procedure where a small, cone-shaped sample of your cervix is taken out. The part that is taken out is the area where the abnormal cells are.  WHAT IF I HAVE A DYSPLASIA OR A CANCER? You may be referred to a specialist. Radiation may also be a treatment for more advanced cancer. Having a hysterectomy is the last treatment option for dysplasia, but it is a more common treatment for someone with cancer. All treatment options will be discussed with you by your caregiver. WHAT SHOULD YOU DO AFTER BEING TREATED? If you have had an abnormal pap test, you should continue to have regular pap tests and check-ups as directed by your caregiver. Your cervical problem will be carefully watched so it does not get worse. Also, your caregiver can watch for, and treat, any new problems that may come up. Document Released: 11/27/2010 Document Revised: 12/07/2012 Document Reviewed: 08/08/2011 [facility] Patient Information 2015 [place], [place]. This information is not intended to replace advice given to you by your health care provider. Make sure you discuss any questions you have with your health care provider.

## 2014-03-02 ENCOUNTER — Telehealth: Payer: Self-pay | Admitting: Obstetrics and Gynecology

## 2014-03-02 NOTE — Telephone Encounter (Signed)
Spoke with patient. Advised that per benefit quote received, she will be responsible for $ copay for PUS. Patient agreeable Scheduled PUS Advised patient of 72 hour cancellation policy and $496 cancellation fee. Patient agreeable.

## 2014-03-17 ENCOUNTER — Encounter: Payer: Self-pay | Admitting: Obstetrics and Gynecology

## 2014-03-17 ENCOUNTER — Ambulatory Visit (INDEPENDENT_AMBULATORY_CARE_PROVIDER_SITE_OTHER): Payer: PRIVATE HEALTH INSURANCE | Admitting: Obstetrics and Gynecology

## 2014-03-17 ENCOUNTER — Telehealth: Payer: Self-pay | Admitting: Obstetrics and Gynecology

## 2014-03-17 ENCOUNTER — Ambulatory Visit (INDEPENDENT_AMBULATORY_CARE_PROVIDER_SITE_OTHER): Payer: PRIVATE HEALTH INSURANCE

## 2014-03-17 VITALS — BP 122/84 | HR 88 | Ht 59.0 in | Wt 163.0 lb

## 2014-03-17 DIAGNOSIS — D259 Leiomyoma of uterus, unspecified: Secondary | ICD-10-CM

## 2014-03-17 DIAGNOSIS — R87619 Unspecified abnormal cytological findings in specimens from cervix uteri: Secondary | ICD-10-CM | POA: Insufficient documentation

## 2014-03-17 DIAGNOSIS — N852 Hypertrophy of uterus: Secondary | ICD-10-CM

## 2014-03-17 DIAGNOSIS — N921 Excessive and frequent menstruation with irregular cycle: Secondary | ICD-10-CM

## 2014-03-17 DIAGNOSIS — D219 Benign neoplasm of connective and other soft tissue, unspecified: Secondary | ICD-10-CM | POA: Insufficient documentation

## 2014-03-17 NOTE — Patient Instructions (Signed)
I will see you back for the colposcopy and endometrial biopsy, which we discussed.   Colposcopy Colposcopy is a procedure to examine your cervix and vagina, or the area around the outside of your vagina, for abnormalities or signs of disease. The procedure is done using a lighted microscope called a colposcope. Tissue samples may be collected during the colposcopy if your health care provider finds any unusual cells. A colposcopy may be done if a woman has:  An abnormal Pap test. A Pap test is a medical test done to evaluate cells that are on the surface of the cervix.  A Pap test result that is suggestive of human papillomavirus (HPV). This virus can cause genital warts and is linked to the development of cervical cancer.  A sore on her cervix and the results of a Pap test were normal.  Genital warts on the cervix or in or around the outside of the vagina.  A mother who took the drug diethylstilbestrol (DES) while pregnant.  Painful intercourse.  Vaginal bleeding, especially after sexual intercourse. LET [facility] CARE PROVIDER KNOW ABOUT:  Any allergies you have.  All medicines you are taking, including vitamins, herbs, eye drops, creams, and over-the-counter medicines.  Previous problems you or members of your family have had with the use of anesthetics.  Any blood disorders you have.  Previous surgeries you have had.  Medical conditions you have. RISKS AND COMPLICATIONS Generally, a colposcopy is a safe procedure. However, as with any procedure, complications can occur. Possible complications include:  Bleeding.  Infection.  Missed lesions. BEFORE THE PROCEDURE   Tell your health care provider if you have your menstrual period. A colposcopy typically is not done during menstruation.  For 24 hours before the colposcopy, do not:  Douche.  Use tampons.  Use medicines, creams, or suppositories in the vagina.  Have sexual intercourse. PROCEDURE  During the  procedure, you will be lying on your back with your feet in foot rests (stirrups). A warm metal or plastic instrument (speculum) will be placed in your vagina to keep it open and to allow the health care provider to see the cervix. The colposcope will be placed outside the vagina. It will be used to magnify and examine the cervix, vagina, and the area around the outside of the vagina. A small amount of liquid solution will be placed on the area that is to be viewed. This solution will make it easier to see the abnormal cells. Your health care provider will use tools to suck out mucus and cells from the canal of the cervix. Then he or she will record the location of the abnormal areas. If a biopsy is done during the procedure, a medicine will usually be given to numb the area (local anesthetic). You may feel mild pain or cramping while the biopsy is done. After the procedure, tissue samples collected during the biopsy will be sent to a lab for analysis. AFTER THE PROCEDURE  You will be given instructions on when to follow up with your health care provider for your test results. It is important to keep your appointment. Document Released: 11/02/2002 Document Revised: 04/14/2013 Document Reviewed: 03/11/2013 [facility] Patient Information 2015 [place], [place]. This information is not intended to replace advice given to you by your health care provider. Make sure you discuss any questions you have with your health care provider.  Endometrial Biopsy Endometrial biopsy is a procedure in which a tissue sample is taken from inside the uterus. The tissue sample  is then looked at under a microscope to see if the tissue is normal or abnormal. The endometrium is the lining of the uterus. This procedure helps determine where you are in your menstrual cycle and how hormone levels are affecting the lining of the uterus. This procedure may also be used to evaluate uterine bleeding or to diagnose endometrial cancer,  tuberculosis, polyps, or inflammatory conditions.  LET [facility] CARE PROVIDER KNOW ABOUT:  Any allergies you have.  All medicines you are taking, including vitamins, herbs, eye drops, creams, and over-the-counter medicines.  Previous problems you or members of your family have had with the use of anesthetics.  Any blood disorders you have.  Previous surgeries you have had.  Medical conditions you have.  Possibility of pregnancy. RISKS AND COMPLICATIONS Generally, this is a safe procedure. However, as with any procedure, complications can occur. Possible complications include:  Bleeding.  Pelvic infection.  Puncture of the uterine wall with the biopsy device (rare). BEFORE THE PROCEDURE   Keep a record of your menstrual cycles as directed by your health care provider. You may need to schedule your procedure for a specific time in your cycle.  You may want to bring a sanitary pad to wear home after the procedure.  Arrange for someone to drive you home after the procedure if you will be given a medicine to help you relax (sedative). PROCEDURE   You may be given a sedative to relax you.  You will lie on an exam table with your feet and legs supported as in a pelvic exam.  Your health care provider will insert an instrument (speculum) into your vagina to see your cervix.  Your cervix will be cleansed with an antiseptic solution. A medicine (local anesthetic) will be used to numb the cervix.  A forceps instrument (tenaculum) will be used to hold your cervix steady for the biopsy.  A thin, rodlike instrument (uterine sound) will be inserted through your cervix to determine the length of your uterus and the location where the biopsy sample will be removed.  A thin, flexible tube (catheter) will be inserted through your cervix and into the uterus. The catheter is used to collect the biopsy sample from your endometrial tissue.  The catheter and speculum will then be removed,  and the tissue sample will be sent to a lab for examination. AFTER THE PROCEDURE  You will rest in a recovery area until you are ready to go home.  You may have mild cramping and a small amount of vaginal bleeding for a few days after the procedure. This is normal.  Make sure you find out how to get your test results. Document Released: 12/13/2004 Document Revised: 04/14/2013 Document Reviewed: 01/27/2013 [facility] Patient Information 2015 [place], [place]. This information is not intended to replace advice given to you by your health care provider. Make sure you discuss any questions you have with your health care provider.

## 2014-03-17 NOTE — Progress Notes (Signed)
Subjective  Her for pelvic ultrasound for enlarged uterus noted on examination.  Menses can be heavy and sometimes come early.   Cycles every 21 - 25 days.  TSH and CBC normal with PCP.   Also has AGUS pap, favor reactive and negative HR HPV.  Colposcopy is not scheduled yet.   Objective  See ultrasound - images and report reviewed with patient.   Uterus with fibroids - largest 3.9 cm, fundal and encroaching on the endometrium.  EMS 10.85. Right ovary with CL collapsed cyst 1.6 mm. Left ovary normal and no cyst.  No free fluid.      Assessment  Fibroid uterus.  AGUS pap and negative HR HPV.   Plan  Discussion regarding fibroids.  Will keep bleeding calendar.  Return for colposcopy and endometrial biopsy. Seeing Gabriel Cirri to discuss any copays and then will schedule with me.   15 minutes face to face time of which over 50% was spent in counseling.   After visit summary to patient.

## 2014-03-17 NOTE — Telephone Encounter (Signed)
Spoke face to face with patient regarding benefit for colpo. Advised that when she comes in, she will pay $250. Advised that the health plan will only cover $250 and any amount of $250 will be her responsibility. Advised that health plan will only cover one service of this type per calendar year. Patient agreeable. Will call to schedule.

## 2014-03-23 NOTE — Telephone Encounter (Signed)
Left message for patient to call back. Need to scheduled colpo

## 2014-03-23 NOTE — Telephone Encounter (Signed)
Returning a call to Sabrina. °

## 2014-03-28 NOTE — Telephone Encounter (Signed)
Spoke with patient. Patient would like to schedule colpo with EMB at this time. Patient started cycle on 8/1. Would like to schedule for Tuesday 8/11. Advised patient that Dr.Silva is out of the office on Tuesday's. Appointment scheduled for Monday 8/10 at [facility] with Dr.Silva. Patient agreeable to date and time.  Routing to provider for final review. Patient agreeable to disposition. Will close encounter

## 2014-03-28 NOTE — Telephone Encounter (Signed)
Patient calling to schedule colpo

## 2014-04-04 ENCOUNTER — Ambulatory Visit (INDEPENDENT_AMBULATORY_CARE_PROVIDER_SITE_OTHER): Payer: PRIVATE HEALTH INSURANCE | Admitting: Obstetrics and Gynecology

## 2014-04-04 ENCOUNTER — Encounter: Payer: Self-pay | Admitting: Obstetrics and Gynecology

## 2014-04-04 VITALS — BP 126/80 | HR 100 | Resp 18 | Ht 59.0 in | Wt 162.2 lb

## 2014-04-04 DIAGNOSIS — N852 Hypertrophy of uterus: Secondary | ICD-10-CM

## 2014-04-04 DIAGNOSIS — N921 Excessive and frequent menstruation with irregular cycle: Secondary | ICD-10-CM

## 2014-04-04 DIAGNOSIS — R87619 Unspecified abnormal cytological findings in specimens from cervix uteri: Secondary | ICD-10-CM

## 2014-04-04 NOTE — Patient Instructions (Signed)
Colposcopy Care After Colposcopy is a procedure in which a special tool is used to magnify the surface of the cervix. A tissue sample (biopsy) may also be taken. This sample will be looked at for cervical cancer or other problems. After the test:  You may have some cramping.  Lie down for a few minutes if you feel lightheaded.   You may have some bleeding which should stop in a few days. HOME CARE  Do not have sex or use tampons for 2 to 3 days or as told.  Only take medicine as told by your doctor.  Continue to take your birth control pills as usual. Finding out the results of your test Ask when your test results will be ready. Make sure you get your test results. GET HELP RIGHT AWAY IF:  You are bleeding a lot or are passing blood clots.  You develop a fever of 102 F (38.9 C) or higher.  You have abnormal vaginal discharge.  You have cramps that do not go away with medicine.  You feel lightheaded, dizzy, or pass out (faint). MAKE SURE YOU:   Understand these instructions.  Will watch your condition.  Will get help right away if you are not doing well or get worse. Document Released: 01/29/2008 Document Revised: 11/04/2011 Document Reviewed: 03/11/2013 [facility] Patient Information 2015 [place], [place]. This information is not intended to replace advice given to you by your health care provider. Make sure you discuss any questions you have with your health care provider.

## 2014-04-04 NOTE — Progress Notes (Signed)
Subjective:     Patient ID: Samantha Key, female   DOB: 1966-08-26, 48 y.o.   MRN: [id number]  HPI  Patient here for colposcopy and EMB for AGUS pap and negative HR HPV.  LMP - 03/26/14 Contraception - condoms  Ultrasound  03/17/14 -  Uterus with fibroids - largest 3.9 cm, fundal and encroaching on the endometrium.  EMS 10.85.  Right ovary with CL collapsed cyst 1.6 mm.  Left ovary normal and no cyst.  No free fluid.    Having hot flashes.   Review of Systems See HPI.    Objective:   Physical Exam  Genitourinary:           Colposcopy of cervix with biopsy, ECC, and EMB Consent for procedure.  Speculum placed.  Acetic acid applied.  Biopsy of exocervix at 12 o'clock. ECC. Cervix sterilized with Hibiclens.  EMB performed with Pipelle placed to 9 cm, twice.  monsel's to cervix. No complications.  Minimal EBL. Assessment:     AGUS pap and negative HR HPV.  Uterine fibroids.  Perimenopausal female.     Plan:     Contact patient with biopsy results.  Instructions given in care.

## 2014-04-06 LAB — IPS OTHER TISSUE BIOPSY

## 2014-06-27 ENCOUNTER — Encounter: Payer: Self-pay | Admitting: Obstetrics and Gynecology

## 2014-12-02 ENCOUNTER — Encounter: Payer: Self-pay | Admitting: Family Medicine

## 2014-12-02 ENCOUNTER — Ambulatory Visit (HOSPITAL_COMMUNITY)
Admission: RE | Admit: 2014-12-02 | Discharge: 2014-12-02 | Disposition: A | Discharge status: Home / Self Care | Payer: PRIVATE HEALTH INSURANCE | Source: Ambulatory Visit | Attending: Family Medicine | Admitting: Family Medicine

## 2014-12-02 ENCOUNTER — Ambulatory Visit (INDEPENDENT_AMBULATORY_CARE_PROVIDER_SITE_OTHER): Payer: BLUE CROSS/BLUE SHIELD | Admitting: Family Medicine

## 2014-12-02 VITALS — BP 117/72 | HR 102 | Temp 98.1°F | Ht 59.0 in | Wt 161.0 lb

## 2014-12-02 DIAGNOSIS — R002 Palpitations: Secondary | ICD-10-CM | POA: Insufficient documentation

## 2014-12-02 DIAGNOSIS — N939 Abnormal uterine and vaginal bleeding, unspecified: Secondary | ICD-10-CM

## 2014-12-02 DIAGNOSIS — Z0000 Encounter for general adult medical examination without abnormal findings: Secondary | ICD-10-CM

## 2014-12-02 DIAGNOSIS — Z13220 Encounter for screening for lipoid disorders: Secondary | ICD-10-CM | POA: Diagnosis not present

## 2014-12-02 LAB — LIPID PANEL
CHOL/HDL RATIO: 3 ratio
CHOLESTEROL: 177 mg/dL (ref 0–200)
HDL: 59 mg/dL (ref >=46)
LDL Cholesterol: 104 mg/dL — ABNORMAL HIGH (ref 0–99)
Triglycerides: 70 mg/dL (ref <150)
VLDL: 14 mg/dL (ref 0–40)

## 2014-12-02 LAB — COMPREHENSIVE METABOLIC PANEL
ALT: 18 U/L (ref 0–35)
AST: 16 U/L (ref 0–37)
Albumin: 4.3 g/dL (ref 3.5–5.2)
Alkaline Phosphatase: 53 U/L (ref 39–117)
BILIRUBIN TOTAL: 0.5 mg/dL (ref 0.2–1.2)
BUN: 13 mg/dL (ref 6–23)
CO2: 26 meq/L (ref 19–32)
CREATININE: 0.69 mg/dL (ref 0.50–1.10)
Calcium: 9.5 mg/dL (ref 8.4–10.5)
Chloride: 104 mEq/L (ref 96–112)
GLUCOSE: 94 mg/dL (ref 70–99)
POTASSIUM: 4.1 meq/L (ref 3.5–5.3)
Sodium: 136 mEq/L (ref 135–145)
Total Protein: 7.6 g/dL (ref 6.0–8.3)

## 2014-12-02 LAB — TSH: TSH: 0.684 u[IU]/mL (ref 0.350–4.500)

## 2014-12-02 LAB — CBC
HCT: 38.1 % (ref 36.0–46.0)
Hemoglobin: 12.8 g/dL (ref 12.0–15.0)
MCH: 30.6 pg (ref 26.0–34.0)
MCHC: 33.6 g/dL (ref 30.0–36.0)
MCV: 91.1 fL (ref 78.0–100.0)
MPV: 9.8 fL (ref 8.6–12.4)
PLATELETS: 309 10*3/uL (ref 150–400)
RBC: 4.18 MIL/uL (ref 3.87–5.11)
RDW: 13.2 % (ref 11.5–15.5)
WBC: 7.2 10*3/uL (ref 4.0–10.5)

## 2014-12-02 NOTE — Assessment & Plan Note (Signed)
EKG performed today and is normal sinus rhythm, without any evidence of structural heart disease. Her cardiac exam is normal today as well. Discussed with her that if she wishes, we can pursue additional workup for this including a 24-hour Holter monitor. Patient states that she would like to continue to observe it for now. She will me know if this gets worse, will come in for a new appointment. Otherwise we will just observe. I will check her TSH along with her other labs today.

## 2014-12-02 NOTE — Patient Instructions (Signed)
Please return for a visit if the palpitations get worse. Your EKG looked great today. Checking lab work. I will call you or send you a letter with these results. See handout below on ways to stay healthy.  Be well, Dr. Ardelia Mems   Health Maintenance Adopting a healthy lifestyle and getting preventive care can go a long way to promote health and wellness. Talk with your health care provider about what schedule of regular examinations is right for you. This is a good chance for you to check in with your provider about disease prevention and staying healthy. In between checkups, there are plenty of things you can do on your own. Experts have done a lot of research about which lifestyle changes and preventive measures are most likely to keep you healthy. Ask your health care provider for more information. WEIGHT AND DIET  Eat a healthy diet  Be sure to include plenty of vegetables, fruits, low-fat dairy products, and lean protein.  Do not eat a lot of foods high in solid fats, added sugars, or salt.  Get regular exercise. This is one of the most important things you can do for your health.  Most adults should exercise for at least 150 minutes each week. The exercise should increase your heart rate and make you sweat (moderate-intensity exercise).  Most adults should also do strengthening exercises at least twice a week. This is in addition to the moderate-intensity exercise.  Maintain a healthy weight  Body mass index (BMI) is a measurement that can be used to identify possible weight problems. It estimates body fat based on height and weight. Your health care provider can help determine your BMI and help you achieve or maintain a healthy weight.  For females 43 years of age and older:   A BMI below 18.5 is considered underweight.  A BMI of 18.5 to 24.9 is normal.  A BMI of 25 to 29.9 is considered overweight.  A BMI of 30 and above is considered obese.  Watch levels of cholesterol  and blood lipids  You should start having your blood tested for lipids and cholesterol at 51 years of age, then have this test every 5 years.  You may need to have your cholesterol levels checked more often if:  Your lipid or cholesterol levels are high.  You are older than 51 years of age.  You are at high risk for heart disease.  CANCER SCREENING   Lung Cancer  Lung cancer screening is recommended for adults 69-46 years old who are at high risk for lung cancer because of a history of smoking.  A yearly low-dose CT scan of the lungs is recommended for people who:  Currently smoke.  Have quit within the past 15 years.  Have at least a 30-pack-year history of smoking. A pack year is smoking an average of one pack of cigarettes a day for 1 year.  Yearly screening should continue until it has been 15 years since you quit.  Yearly screening should stop if you develop a health problem that would prevent you from having lung cancer treatment.  Breast Cancer  Practice breast self-awareness. This means understanding how your breasts normally appear and feel.  It also means doing regular breast self-exams. Let your health care provider know about any changes, no matter how small.  If you are in your 20s or 30s, you should have a clinical breast exam (CBE) by a health care provider every 1-3 years as part of a regular health  exam.  If you are 40 or older, have a CBE every year. Also consider having a breast X-ray (mammogram) every year.  If you have a family history of breast cancer, talk to your health care provider about genetic screening.  If you are at high risk for breast cancer, talk to your health care provider about having an MRI and a mammogram every year.  Breast cancer gene (BRCA) assessment is recommended for women who have family members with BRCA-related cancers. BRCA-related cancers include:  Breast.  Ovarian.  Tubal.  Peritoneal cancers.  Results of the  assessment will determine the need for genetic counseling and BRCA1 and BRCA2 testing. Cervical Cancer Routine pelvic examinations to screen for cervical cancer are no longer recommended for nonpregnant women who are considered low risk for cancer of the pelvic organs (ovaries, uterus, and vagina) and who do not have symptoms. A pelvic examination may be necessary if you have symptoms including those associated with pelvic infections. Ask your health care provider if a screening pelvic exam is right for you.   The Pap test is the screening test for cervical cancer for women who are considered at risk.  If you had a hysterectomy for a problem that was not cancer or a condition that could lead to cancer, then you no longer need Pap tests.  If you are older than 65 years, and you have had normal Pap tests for the past 10 years, you no longer need to have Pap tests.  If you have had past treatment for cervical cancer or a condition that could lead to cancer, you need Pap tests and screening for cancer for at least 20 years after your treatment.  If you no longer get a Pap test, assess your risk factors if they change (such as having a new sexual partner). This can affect whether you should start being screened again.  Some women have medical problems that increase their chance of getting cervical cancer. If this is the case for you, your health care provider may recommend more frequent screening and Pap tests.  The human papillomavirus (HPV) test is another test that may be used for cervical cancer screening. The HPV test looks for the virus that can cause cell changes in the cervix. The cells collected during the Pap test can be tested for HPV.  The HPV test can be used to screen women 72 years of age and older. Getting tested for HPV can extend the interval between normal Pap tests from three to five years.  An HPV test also should be used to screen women of any age who have unclear Pap test  results.  After 51 years of age, women should have HPV testing as often as Pap tests.  Colorectal Cancer  This type of cancer can be detected and often prevented.  Routine colorectal cancer screening usually begins at 51 years of age and continues through 51 years of age.  Your health care provider may recommend screening at an earlier age if you have risk factors for colon cancer.  Your health care provider may also recommend using home test kits to check for hidden blood in the stool.  A small camera at the end of a tube can be used to examine your colon directly (sigmoidoscopy or colonoscopy). This is done to check for the earliest forms of colorectal cancer.  Routine screening usually begins at age 9.  Direct examination of the colon should be repeated every 5-10 years through 51 years of  age. However, you may need to be screened more often if early forms of precancerous polyps or small growths are found. Skin Cancer  Check your skin from head to toe regularly.  Tell your health care provider about any new moles or changes in moles, especially if there is a change in a mole's shape or color.  Also tell your health care provider if you have a mole that is larger than the size of a pencil eraser.  Always use sunscreen. Apply sunscreen liberally and repeatedly throughout the day.  Protect yourself by wearing long sleeves, pants, a wide-brimmed hat, and sunglasses whenever you are outside. HEART DISEASE, DIABETES, AND HIGH BLOOD PRESSURE   Have your blood pressure checked at least every 1-2 years. High blood pressure causes heart disease and increases the risk of stroke.  If you are between 98 years and 20 years old, ask your health care provider if you should take aspirin to prevent strokes.  Have regular diabetes screenings. This involves taking a blood sample to check your fasting blood sugar level.  If you are at a normal weight and have a low risk for diabetes, have this  test once every three years after 51 years of age.  If you are overweight and have a high risk for diabetes, consider being tested at a younger age or more often. PREVENTING INFECTION  Hepatitis B  If you have a higher risk for hepatitis B, you should be screened for this virus. You are considered at high risk for hepatitis B if:  You were born in a country where hepatitis B is common. Ask your health care provider which countries are considered high risk.  Your parents were born in a high-risk country, and you have not been immunized against hepatitis B (hepatitis B vaccine).  You have HIV or AIDS.  You use needles to inject street drugs.  You live with someone who has hepatitis B.  You have had sex with someone who has hepatitis B.  You get hemodialysis treatment.  You take certain medicines for conditions, including cancer, organ transplantation, and autoimmune conditions. Hepatitis C  Blood testing is recommended for:  Everyone born from [birth year] through 1967.  Anyone with known risk factors for hepatitis C. Sexually transmitted infections (STIs)  You should be screened for sexually transmitted infections (STIs) including gonorrhea and chlamydia if:  You are sexually active and are younger than 51 years of age.  You are older than 51 years of age and your health care provider tells you that you are at risk for this type of infection.  Your sexual activity has changed since you were last screened and you are at an increased risk for chlamydia or gonorrhea. Ask your health care provider if you are at risk.  If you do not have HIV, but are at risk, it may be recommended that you take a prescription medicine daily to prevent HIV infection. This is called pre-exposure prophylaxis (PrEP). You are considered at risk if:  You are sexually active and do not regularly use condoms or know the HIV status of your partner(s).  You take drugs by injection.  You are sexually active with  a partner who has HIV. Talk with your health care provider about whether you are at high risk of being infected with HIV. If you choose to begin PrEP, you should first be tested for HIV. You should then be tested every 3 months for as long as you are taking PrEP.  PREGNANCY  If you are premenopausal and you may become pregnant, ask your health care provider about preconception counseling.  If you may become pregnant, take 400 to 800 micrograms (mcg) of folic acid every day.  If you want to prevent pregnancy, talk to your health care provider about birth control (contraception). OSTEOPOROSIS AND MENOPAUSE   Osteoporosis is a disease in which the bones lose minerals and strength with aging. This can result in serious bone fractures. Your risk for osteoporosis can be identified using a bone density scan.  If you are 65 years of age or older, or if you are at risk for osteoporosis and fractures, ask your health care provider if you should be screened.  Ask your health care provider whether you should take a calcium or vitamin D supplement to lower your risk for osteoporosis.  Menopause may have certain physical symptoms and risks.  Hormone replacement therapy may reduce some of these symptoms and risks. Talk to your health care provider about whether hormone replacement therapy is right for you.  HOME CARE INSTRUCTIONS   Schedule regular health, dental, and eye exams.  Stay current with your immunizations.   Do not use any tobacco products including cigarettes, chewing tobacco, or electronic cigarettes.  If you are pregnant, do not drink alcohol.  If you are breastfeeding, limit how much and how often you drink alcohol.  Limit alcohol intake to no more than 1 drink per day for nonpregnant women. One drink equals 12 ounces of beer, 5 ounces of wine, or 1 ounces of hard liquor.  Do not use street drugs.  Do not share needles.  Ask your health care provider for help if you need  support or information about quitting drugs.  Tell your health care provider if you often feel depressed.  Tell your health care provider if you have ever been abused or do not feel safe at home. Document Released: 02/25/2011 Document Revised: 12/27/2013 Document Reviewed: 07/14/2013 ExitCare Patient Information 2015 [employer]. This information is not intended to replace advice given to you by your health care provider. Make sure you discuss any questions you have with your health care provider.  

## 2014-12-02 NOTE — Progress Notes (Signed)
Patient ID: Samantha Key, female   DOB: [birth year], 49 y.o.   MRN: [id number] HPI:  Patient presents today for a well woman exam.   Concerns today: palpitations, see below Periods: occ bleeding in between periods, has had endometrial bx last year Contraception: condoms every time Pelvic symptoms: no pelvic pain or discharge Sexual activity: yes, one female partner in last year STD Screening: declines Pap smear status: due august for repeat at OB/GYN office Exercise: none Smoking: no Alcohol: no Drugs: no Advance directives: full code  Palpitations: This happens about 2-3 times per day, sometimes lasting days at a time. It is unrelated to exertion. She denies any chest pain or shortness of breath. She has occasional chronic right lower extremity swelling, but is not worse lately. She denies any associated dizziness, lightheadedness, or passing out.  ROS: See HPI  [place]:  Cancers in family:  Brother just died of lung cancer age 44, he was a smoker Mom colon cancer dx'd in 50-60's (pt has had colonoscopy 3 yrs ago, repeat needed in 5 yrs) Dad died of stomach cancer  PHYSICAL EXAM: BP 117/72 mmHg  Pulse 102  Temp(Src) 98.1 F (36.7 C) (Oral)  Ht 4\' 11"  (1.499 m)  Wt 161 lb (73.029 kg)  BMI 32.50 kg/m2  LMP 11/13/2014 Gen: NAD, pleasant, cooperative HEENT: NCAT, PERRL, no palpable thyromegaly or anterior cervical lymphadenopathy, TM clear bilaterally, oropharynx clear & moist, nares patent Heart: RRR, no murmurs Lungs: CTAB, NWOB Abdomen: soft, nontender to palpation Neuro: grossly nonfocal, speech normal  ASSESSMENT/PLAN:  Health maintenance:  -STD screening: declines today -pap smear: due for this in Aug at OB/GYN office due to hx of atypical glandular cells -mammogram: pt will schedule -colonoscopy: had early screening due to family hx of colon cancer, due for repeat in Dec 2018 -lipid screening: check lipids & CMET today -intermenstrual bleeding: had endometrial bx  that was normal last year. Will f/u with OB/GYN in Aug. I will go ahead and check CBC today with her bloodwork to ensure not anemic. -advance directives: full code -HIV screening: Discussed CDC recommendation for one-time HIV testing in all adults. She declines this today. States she does not have HIV. -handout given on health maintenance topics  Palpitations EKG performed today and is normal sinus rhythm, without any evidence of structural heart disease. Her cardiac exam is normal today as well. Discussed with her that if she wishes, we can pursue additional workup for this including a 24-hour Holter monitor. Patient states that she would like to continue to observe it for now. She will me know if this gets worse, will come in for a new appointment. Otherwise we will just observe. I will check her TSH along with her other labs today.   FOLLOW UP: F/u in 1 year for next well woman visit F/u with OB/GYN in Aug for next pap  [name], [place]

## 2014-12-09 ENCOUNTER — Encounter: Payer: Self-pay | Admitting: Family Medicine

## 2014-12-14 ENCOUNTER — Other Ambulatory Visit: Payer: Self-pay | Admitting: Family Medicine

## 2014-12-14 DIAGNOSIS — Z1231 Encounter for screening mammogram for malignant neoplasm of breast: Secondary | ICD-10-CM

## 2014-12-16 ENCOUNTER — Ambulatory Visit (HOSPITAL_COMMUNITY)
Admission: RE | Admit: 2014-12-16 | Discharge: 2014-12-16 | Disposition: A | Discharge status: Home / Self Care | Payer: BLUE CROSS/BLUE SHIELD | Source: Ambulatory Visit | Attending: Family Medicine | Admitting: Family Medicine

## 2014-12-16 DIAGNOSIS — Z1231 Encounter for screening mammogram for malignant neoplasm of breast: Secondary | ICD-10-CM

## 2015-05-05 ENCOUNTER — Encounter: Payer: Self-pay | Admitting: Obstetrics and Gynecology

## 2015-05-05 ENCOUNTER — Ambulatory Visit (INDEPENDENT_AMBULATORY_CARE_PROVIDER_SITE_OTHER): Payer: BLUE CROSS/BLUE SHIELD | Admitting: Obstetrics and Gynecology

## 2015-05-05 VITALS — BP 122/84 | HR 80 | Resp 18 | Ht 59.0 in | Wt 168.2 lb

## 2015-05-05 DIAGNOSIS — Z01419 Encounter for gynecological examination (general) (routine) without abnormal findings: Secondary | ICD-10-CM

## 2015-05-05 DIAGNOSIS — Z0000 Encounter for general adult medical examination without abnormal findings: Secondary | ICD-10-CM | POA: Diagnosis not present

## 2015-05-05 LAB — POCT URINALYSIS DIPSTICK
BILIRUBIN UA: NEGATIVE
Blood, UA: NEGATIVE
Glucose, UA: NEGATIVE
Ketones, UA: NEGATIVE
LEUKOCYTES UA: NEGATIVE
NITRITE UA: NEGATIVE
PH UA: 5
Protein, UA: NEGATIVE
Urobilinogen, UA: NEGATIVE

## 2015-05-05 NOTE — Patient Instructions (Addendum)

## 2015-05-05 NOTE — Progress Notes (Signed)
Patient ID: Samantha Key, female   DOB: [birth year], 49 y.o.   MRN: [id number] 49 y.o. [id number] Married [place] American female here for annual exam.    Menses every month, sometimes a few days early.  Menses last 4 - 5 days. Some spotting at beginning and end of cycle.   Rare urinary leakage with sneeze.  No regular Kegel's.   PCP: Dr. Ardelia Mems  Patient's last menstrual period was 04/03/2015 (exact date).          Sexually active: Yes.   female The current method of family planning is condoms everytime.   Satisfied with this.  Exercising: No.   Smoker:  no  Health Maintenance: Pap:  02-01-14 AGUS:Neg HR HPV History of abnormal Pap:  Yes, AGUS:Neg HR HPV;colposcopy ECC negative, Exocervix negative and Endometrial biopsy negative. MMG:  12-16-14 Density Cat.C/Neg:[facility]. Colonoscopy:  07/2012 polyp with Dr. Carlean Purl.  Next due 07/2017. BMD:   n/a  Result  n/a TDaP:  09/2010 Screening Labs:  Hb today: PCP, Urine today: Neg   reports that she has never smoked. She has never used smokeless tobacco. She reports that she does not drink alcohol or use illicit drugs.  Past Medical History  Diagnosis Date  . Personal history of adenomatous colonic polyp 08/18/2012    07/2012 - 5 mm adenoma  . Abnormal Pap smear of cervix 01-31-14    --AGUS pap with neg HR HPV with [facility]  . Fibroid     Past Surgical History  Procedure Laterality Date  . No prior surgery      Current Outpatient Prescriptions  Medication Sig Dispense Refill  . cholecalciferol (VITAMIN D) 400 UNITS TABS Take by mouth.    . Multiple Vitamin (MULTIVITAMIN) capsule Take 1 capsule by mouth daily.     No current facility-administered medications for this visit.    Family History  Problem Relation Age of Onset  . Diabetes Mother   . Hypertension Mother   . Cancer Mother     Colon, diagnosed late 42s  . Colon cancer Mother 54  . Cancer Father   . Stomach cancer Father 70  . Diabetes Sister   .  Hypertension Sister   . Seizures Brother   . Diabetes Brother   . Cancer Brother     Dec lung CA at 75    ROS:  Pertinent items are noted in HPI.  Otherwise, a comprehensive ROS was negative.  Exam:   BP 122/84 mmHg  Pulse 80  Resp 18  Ht 4\' 11"  (1.499 m)  Wt 168 lb 3.2 oz (76.295 kg)  BMI 33.95 kg/m2  LMP 04/03/2015 (Exact Date)    General appearance: alert, cooperative and appears stated age Head: Normocephalic, without obvious abnormality, atraumatic Neck: no adenopathy, supple, symmetrical, trachea midline and thyroid normal to inspection and palpation Lungs: clear to auscultation bilaterally Breasts: normal appearance, no masses or tenderness, Inspection negative, No nipple retraction or dimpling, No nipple discharge or bleeding, No axillary or supraclavicular adenopathy Heart: regular rate and rhythm Abdomen: soft, non-tender; bowel sounds normal; no masses,  no organomegaly Extremities: extremities normal, atraumatic, no cyanosis or edema Skin: Skin color, texture, turgor normal. No rashes or lesions Lymph nodes: Cervical, supraclavicular, and axillary nodes normal. No abnormal inguinal nodes palpated Neurologic: Grossly normal  Pelvic: External genitalia:  no lesions              Urethra:  normal appearing urethra with no masses, tenderness or lesions  Bartholins and Skenes: normal                 Vagina: normal appearing vagina with normal color and discharge, no lesions              Cervix: no lesions              Pap taken: Yes.   Bimanual Exam:  Uterus:  enlarged,  8 weeks size.  3 cm fundal fibroid.               Adnexa: normal adnexa and no mass, fullness, tenderness              Rectovaginal: Yes.  .  Confirms.              Anus:  normal sphincter tone, no lesions  Chaperone was present for exam.  Assessment:   Well woman visit with normal exam. Hx AGUS pap and negative work up.  Fibroid uterus.   Plan: Yearly mammogram recommended after age  35.  Recommended self breast exam.  Pap and HR HPV as above. Discussed Calcium, Vitamin D, regular exercise program including cardiovascular and weight bearing exercise. Labs performed.  No..     Refills given on medications.  No..    Follow up annually and prn.      After visit summary provided.

## 2015-05-09 LAB — IPS PAP TEST WITH HPV

## 2015-05-10 ENCOUNTER — Telehealth: Payer: Self-pay

## 2015-05-10 NOTE — Telephone Encounter (Signed)
Called patient to discuss pap smear results at [phone] and left message to call me.

## 2015-05-10 NOTE — Telephone Encounter (Signed)
Patient returning your call.

## 2015-05-10 NOTE — Telephone Encounter (Signed)
-----   Message from Nunzio Cobbs, MD sent at 05/09/2015  8:45 PM EDT ----- Please inform patient of negative Pap and negative HR HPV status.  I am recommending she have cotesting in one year.  Enter 08 recall for her history of AGUS in 2014.  She had a negative work up.

## 2015-05-11 NOTE — Telephone Encounter (Signed)
Patient notified of results.

## 2015-07-07 ENCOUNTER — Ambulatory Visit (INDEPENDENT_AMBULATORY_CARE_PROVIDER_SITE_OTHER): Payer: BLUE CROSS/BLUE SHIELD | Admitting: Student

## 2015-07-07 VITALS — BP 129/80 | HR 89 | Temp 98.7°F | Ht 59.0 in | Wt 164.5 lb

## 2015-07-07 DIAGNOSIS — K1379 Other lesions of oral mucosa: Secondary | ICD-10-CM | POA: Diagnosis not present

## 2015-07-07 NOTE — Progress Notes (Signed)
   Subjective:    Patient ID: Samantha Key, female    DOB: 21-Sep-1965, 49 y.o.   MRN: [id number]   CC: recent cyst in mouth  HPI 49 y/o presents for " mass in mouth" 3 days ago  Oral Cyst - Noted 3 days ago after eating " hard" bread at approx 9 pm - started as a small bump on posterior portion of palate then grew to the size of a " cherry" - it burst and leaked bloody fluid while she was brushing her teeth the next AM.  - She report she has some trouble swalling because of it but no SOB - She has had a similar oral lesion to this in teh past but it was much smaller - since it ruptured she has a flat area of muld irritation in its place - has been using salt mouth rinses since it rptured   Review of Systems   See HPI for ROS.   Past Medical History  Diagnosis Date  . Personal history of adenomatous colonic polyp 08/18/2012    07/2012 - 5 mm adenoma  . Abnormal Pap smear of cervix 01-31-14    --AGUS pap with neg HR HPV with [facility]  . Fibroid    Past Surgical History  Procedure Laterality Date  . No prior surgery     OB History    Gravida Para Term Preterm AB TAB SAB Ectopic Multiple Living   1 1 1       1      Social History   Social History  . Marital Status: Married    Spouse Name: N/A  . Number of Children: N/A  . Years of Education: N/A   Occupational History  . Not on file.   Social History Main Topics  . Smoking status: Never Smoker   . Smokeless tobacco: Never Used  . Alcohol Use: No  . Drug Use: No  . Sexual Activity:    Partners: Male    Birth Control/ Protection: Condom   Other Topics Concern  . Not on file   Social History Narrative    Objective:  BP 129/80 mmHg  Pulse 89  Temp(Src) 98.7 F (37.1 C) (Oral)  Ht 4\' 11"  (1.499 m)  Wt 164 lb 8 oz (74.617 kg)  BMI 33.21 kg/m2  LMP 07/04/2015 (Exact Date) Vitals and nursing note reviewed  General: NAD Cardiac/Pulm; normal WOB Oropharynx: flat approx 1 cm in diameter area of  mild erythema else normal oropharynx    Assessment & Plan:    Oral mucocele Lesion described most consistent with oral mucocele given its relation to potential associated oral trauma of eating a hard piece of food - Pt advised to use mouth washes like " magic mouth wash" for pain control but she declined - pt wishes to continue salt rinses - pt will return of this should occur again      Alyssa A. Lincoln Brigham MD, [facility] Resident PGY-1 Pager [phone]

## 2015-07-07 NOTE — Patient Instructions (Signed)
If you have questions or concerns, call the office  Shoulder Pain The shoulder is the joint that connects your arms to your body. The bones that form the shoulder joint include the upper arm bone (humerus), the shoulder blade (scapula), and the collarbone (clavicle). The top of the humerus is shaped like a ball and fits into a rather flat socket on the scapula (glenoid cavity). A combination of muscles and strong, fibrous tissues that connect muscles to bones (tendons) support your shoulder joint and hold the ball in the socket. Small, fluid-filled sacs (bursae) are located in different areas of the joint. They act as cushions between the bones and the overlying soft tissues and help reduce friction between the gliding tendons and the bone as you move your arm. Your shoulder joint allows a wide range of motion in your arm. This range of motion allows you to do things like scratch your back or throw a ball. However, this range of motion also makes your shoulder more prone to pain from overuse and injury. Causes of shoulder pain can originate from both injury and overuse and usually can be grouped in the following four categories:  Redness, swelling, and pain (inflammation) of the tendon (tendinitis) or the bursae (bursitis).  Instability, such as a dislocation of the joint.  Inflammation of the joint (arthritis).  Broken bone (fracture). HOME CARE INSTRUCTIONS   Apply ice to the sore area.  Put ice in a plastic bag.  Place a towel between your skin and the bag.  Leave the ice on for 15-20 minutes, 3-4 times per day for the first 2 days, or as directed by your health care provider.  Stop using cold packs if they do not help with the pain.  If you have a shoulder sling or immobilizer, wear it as long as your caregiver instructs. Only remove it to shower or bathe. Move your arm as little as possible, but keep your hand moving to prevent swelling.  Squeeze a soft ball or foam pad as much as  possible to help prevent swelling.  Only take over-the-counter or prescription medicines for pain, discomfort, or fever as directed by your caregiver. SEEK MEDICAL CARE IF:   Your shoulder pain increases, or new pain develops in your arm, hand, or fingers.  Your hand or fingers become cold and numb.  Your pain is not relieved with medicines. SEEK IMMEDIATE MEDICAL CARE IF:   Your arm, hand, or fingers are numb or tingling.  Your arm, hand, or fingers are significantly swollen or turn white or blue. MAKE SURE YOU:   Understand these instructions.  Will watch your condition.  Will get help right away if you are not doing well or get worse.   This information is not intended to replace advice given to you by your health care provider. Make sure you discuss any questions you have with your health care provider.   Document Released: 05/22/2005 Document Revised: 09/02/2014 Document Reviewed: 12/05/2014 Elsevier Interactive Patient Education [employer].

## 2015-07-07 NOTE — Assessment & Plan Note (Signed)
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

## 2015-07-13 ENCOUNTER — Telehealth: Payer: Self-pay | Admitting: Family Medicine

## 2015-07-13 NOTE — Telephone Encounter (Signed)
Pt called because when she seen Dr. Lincoln Brigham on 07/07/15 the doctor offered her a mouthwash. At the time she didn't think that she would need this. She said that she would like to have this called in. jw

## 2015-07-14 MED ORDER — MAGIC MOUTHWASH W/LIDOCAINE: 5.0000 mL | Freq: Three times a day (TID) | ORAL | Status: DC | PRN

## 2015-07-14 NOTE — Telephone Encounter (Signed)
Rx for magic mouth wash printed and placed at front. Please inform the patient  Samantha Key

## 2015-07-14 NOTE — Telephone Encounter (Signed)
Patient is aware of this. Samantha Key,CMA  

## 2016-02-06 ENCOUNTER — Other Ambulatory Visit: Payer: Self-pay | Admitting: Family Medicine

## 2016-02-06 DIAGNOSIS — Z1231 Encounter for screening mammogram for malignant neoplasm of breast: Secondary | ICD-10-CM

## 2016-02-07 ENCOUNTER — Encounter: Payer: PRIVATE HEALTH INSURANCE | Admitting: Family Medicine

## 2016-02-13 ENCOUNTER — Ambulatory Visit
Admission: RE | Admit: 2016-02-13 | Discharge: 2016-02-13 | Disposition: A | Discharge status: Home / Self Care | Payer: BLUE CROSS/BLUE SHIELD | Source: Ambulatory Visit | Attending: Family Medicine | Admitting: Family Medicine

## 2016-02-13 DIAGNOSIS — Z1231 Encounter for screening mammogram for malignant neoplasm of breast: Secondary | ICD-10-CM

## 2016-02-19 ENCOUNTER — Ambulatory Visit (INDEPENDENT_AMBULATORY_CARE_PROVIDER_SITE_OTHER): Payer: BLUE CROSS/BLUE SHIELD | Admitting: Family Medicine

## 2016-02-19 ENCOUNTER — Encounter: Payer: Self-pay | Admitting: Family Medicine

## 2016-02-19 VITALS — BP 129/79 | HR 91 | Temp 98.6°F | Ht 59.0 in | Wt 164.4 lb

## 2016-02-19 DIAGNOSIS — Z0000 Encounter for general adult medical examination without abnormal findings: Secondary | ICD-10-CM

## 2016-02-19 DIAGNOSIS — Z1159 Encounter for screening for other viral diseases: Secondary | ICD-10-CM

## 2016-02-19 DIAGNOSIS — Z13220 Encounter for screening for lipoid disorders: Secondary | ICD-10-CM

## 2016-02-19 DIAGNOSIS — Z114 Encounter for screening for human immunodeficiency virus [HIV]: Secondary | ICD-10-CM

## 2016-02-19 LAB — COMPLETE METABOLIC PANEL WITH GFR
ALT: 14 U/L (ref 6–29)
AST: 13 U/L (ref 10–35)
Albumin: 4.2 g/dL (ref 3.6–5.1)
Alkaline Phosphatase: 48 U/L (ref 33–130)
BUN: 13 mg/dL (ref 7–25)
CO2: 24 mmol/L (ref 20–31)
CREATININE: 0.63 mg/dL (ref 0.50–1.05)
Calcium: 9.1 mg/dL (ref 8.6–10.4)
Chloride: 104 mmol/L (ref 98–110)
GFR, Est African American: >89 mL/min (ref >=60)
GFR, Est Non African American: >89 mL/min (ref >=60)
GLUCOSE: 104 mg/dL — AB (ref 65–99)
POTASSIUM: 4.3 mmol/L (ref 3.5–5.3)
SODIUM: 136 mmol/L (ref 135–146)
Total Bilirubin: 0.4 mg/dL (ref 0.2–1.2)
Total Protein: 7.4 g/dL (ref 6.1–8.1)

## 2016-02-19 LAB — LIPID PANEL
CHOL/HDL RATIO: 2.4 ratio (ref <=5.0)
CHOLESTEROL: 180 mg/dL (ref 125–200)
HDL: 74 mg/dL (ref >=46)
LDL CALC: 93 mg/dL (ref <130)
Triglycerides: 63 mg/dL (ref <150)
VLDL: 13 mg/dL (ref <30)

## 2016-02-19 NOTE — Progress Notes (Signed)
Date of Visit: 02/19/2016   HPI:  Patient presents today for a well woman exam.   Concerns today: multiple minor concerns, see below Periods: monthly periods, sometimes come early, some spotting in between periods, follows with GYN Contraception: condoms Pelvic symptoms: denies abnormal discharge or pelvic pain STD Screening: declines this today Pap smear status: due for next pap September 2017, managed by OB/GYN provider. Exercise: none Smoking: none Alcohol: none Drugs: none Mood: denies depression or SI Dentist: sees dentist regularly  Concerns: - sinus drainage - had sore throat & cough about 3 weeks ago. Treated with robitussin & saline gargles. Still has mild drainage on & off. No fever.  - left supraclavicular fullness - feels like this area is sometimes swollen. No night sweats, fever, weight loss.   - R shoulder hyperpigmentation - wants to know if she should see dermatologist for it. Has not changed. Mildly darkened.  - bump on inside of R tongue - was there over the weekend, resolved today  ROS: See HPI  [place]:  Cancers in family: colon  PHYSICAL EXAM: BP 129/79 mmHg  Pulse 91  Temp(Src) 98.6 F (37 C) (Oral)  Ht 4\' 11"  (1.499 m)  Wt 164 lb 6.4 oz (74.571 kg)  BMI 33.19 kg/m2  LMP 01/27/2016 Gen: NAD, pleasant, cooperative HEENT: NCAT, PERRL, no palpable thyromegaly or anterior cervical lymphadenopathy. No supraclavicular swelling or adenopathy appreciated bilaterally. Tympanic membranes clear bilaterally. Nares patent. No sinus tenderness bilaterally Heart: RRR, no murmurs Lungs: clear to auscultation bilaterally, normal work of breathing  Abdomen: soft, nontender to palpation, no masses or organomegaly Neuro: grossly nonfocal, speech normal Skin: area of very slight hyperpigmentation to the R shoulder Psych: normal range of affect, well groomed, speech normal in rate and volume, normal eye contact  Extremities: full strength bilateral upper extremities.  No appreciable lower extremity edema bilaterally   ASSESSMENT/PLAN:  Health maintenance:  -STD screening: declines today, will check HIV ab & hep C ab with labs (consents to these) -pap smear: due Sept, followed by OB/GYN -mammogram: normal 02/13/16 -lipid screening: check lipids today -immunizations: tdap reviewed, utd last in 2012 -handout given on health maintenance topics  - sinus drainage - postviral drainage versus allergic. No signs of bacterial ifnxn today. Recommend over the counter zyrtec as needed.   - left supraclavicular fullness - reported by patient but appears normal to me today. No lymphadenopathy or swelling appreciated. Full strength bilateral UE. No B symptoms. Patient reassurred by my exam. Instructed to follow up if changing or worsening.  - R shoulder hyperpigmentation - very mildly hyperpigmented without any suspicious lesions. No need to see dermatology unless she has other concerns.   - bump on inside of R tongue - was there over the weekend, resolved today, not visible on exam, follow up as needed   FOLLOW UP: Follow up in 1 year for next physical exam, sooner as needed  Continue to follow up with gynecology   [name], [place]

## 2016-02-19 NOTE — Patient Instructions (Signed)
Follow up with your gynecologist - make sure you mention the spotting in between periods  Checking labs today: kidneys, liver, cholesterol, hepatitis C  Follow up with me in 1 year, sooner if any concerns  Be well, Dr. Ardelia Mems     Health Maintenance, Female Adopting a healthy lifestyle and getting preventive care can go a long way to promote health and wellness. Talk with your health care provider about what schedule of regular examinations is right for you. This is a good chance for you to check in with your provider about disease prevention and staying healthy. In between checkups, there are plenty of things you can do on your own. Experts have done a lot of research about which lifestyle changes and preventive measures are most likely to keep you healthy. Ask your health care provider for more information. WEIGHT AND DIET  Eat a healthy diet  Be sure to include plenty of vegetables, fruits, low-fat dairy products, and lean protein.  Do not eat a lot of foods high in solid fats, added sugars, or salt.  Get regular exercise. This is one of the most important things you can do for your health.  Most adults should exercise for at least 150 minutes each week. The exercise should increase your heart rate and make you sweat (moderate-intensity exercise).  Most adults should also do strengthening exercises at least twice a week. This is in addition to the moderate-intensity exercise.  Maintain a healthy weight  Body mass index (BMI) is a measurement that can be used to identify possible weight problems. It estimates body fat based on height and weight. Your health care provider can help determine your BMI and help you achieve or maintain a healthy weight.  For females 17 years of age and older:   A BMI below 18.5 is considered underweight.  A BMI of 18.5 to 24.9 is normal.  A BMI of 25 to 29.9 is considered overweight.  A BMI of 30 and above is considered obese.  Watch levels of  cholesterol and blood lipids  You should start having your blood tested for lipids and cholesterol at 52 years of age, then have this test every 5 years.  You may need to have your cholesterol levels checked more often if:  Your lipid or cholesterol levels are high.  You are older than 52 years of age.  You are at high risk for heart disease.  CANCER SCREENING   Lung Cancer  Lung cancer screening is recommended for adults 24-13 years old who are at high risk for lung cancer because of a history of smoking.  A yearly low-dose CT scan of the lungs is recommended for people who:  Currently smoke.  Have quit within the past 15 years.  Have at least a 30-pack-year history of smoking. A pack year is smoking an average of one pack of cigarettes a day for 1 year.  Yearly screening should continue until it has been 15 years since you quit.  Yearly screening should stop if you develop a health problem that would prevent you from having lung cancer treatment.  Breast Cancer  Practice breast self-awareness. This means understanding how your breasts normally appear and feel.  It also means doing regular breast self-exams. Let your health care provider know about any changes, no matter how small.  If you are in your 20s or 30s, you should have a clinical breast exam (CBE) by a health care provider every 1-3 years as part of a regular  health exam.  If you are 40 or older, have a CBE every year. Also consider having a breast X-ray (mammogram) every year.  If you have a family history of breast cancer, talk to your health care provider about genetic screening.  If you are at high risk for breast cancer, talk to your health care provider about having an MRI and a mammogram every year.  Breast cancer gene (BRCA) assessment is recommended for women who have family members with BRCA-related cancers. BRCA-related cancers include:  Breast.  Ovarian.  Tubal.  Peritoneal  cancers.  Results of the assessment will determine the need for genetic counseling and BRCA1 and BRCA2 testing. Cervical Cancer Your health care provider may recommend that you be screened regularly for cancer of the pelvic organs (ovaries, uterus, and vagina). This screening involves a pelvic examination, including checking for microscopic changes to the surface of your cervix (Pap test). You may be encouraged to have this screening done every 3 years, beginning at age 67.  For women ages 31-65, health care providers may recommend pelvic exams and Pap testing every 3 years, or they may recommend the Pap and pelvic exam, combined with testing for human papilloma virus (HPV), every 5 years. Some types of HPV increase your risk of cervical cancer. Testing for HPV may also be done on women of any age with unclear Pap test results.  Other health care providers may not recommend any screening for nonpregnant women who are considered low risk for pelvic cancer and who do not have symptoms. Ask your health care provider if a screening pelvic exam is right for you.  If you have had past treatment for cervical cancer or a condition that could lead to cancer, you need Pap tests and screening for cancer for at least 20 years after your treatment. If Pap tests have been discontinued, your risk factors (such as having a new sexual partner) need to be reassessed to determine if screening should resume. Some women have medical problems that increase the chance of getting cervical cancer. In these cases, your health care provider may recommend more frequent screening and Pap tests. Colorectal Cancer  This type of cancer can be detected and often prevented.  Routine colorectal cancer screening usually begins at 52 years of age and continues through 52 years of age.  Your health care provider may recommend screening at an earlier age if you have risk factors for colon cancer.  Your health care provider may also  recommend using home test kits to check for hidden blood in the stool.  A small camera at the end of a tube can be used to examine your colon directly (sigmoidoscopy or colonoscopy). This is done to check for the earliest forms of colorectal cancer.  Routine screening usually begins at age 12.  Direct examination of the colon should be repeated every 5-10 years through 52 years of age. However, you may need to be screened more often if early forms of precancerous polyps or small growths are found. Skin Cancer  Check your skin from head to toe regularly.  Tell your health care provider about any new moles or changes in moles, especially if there is a change in a mole's shape or color.  Also tell your health care provider if you have a mole that is larger than the size of a pencil eraser.  Always use sunscreen. Apply sunscreen liberally and repeatedly throughout the day.  Protect yourself by wearing long sleeves, pants, a wide-brimmed hat, and  sunglasses whenever you are outside. HEART DISEASE, DIABETES, AND HIGH BLOOD PRESSURE   High blood pressure causes heart disease and increases the risk of stroke. High blood pressure is more likely to develop in:  People who have blood pressure in the high end of the normal range (130-139/85-89 mm Hg).  People who are overweight or obese.  People who are African American.  If you are 59-43 years of age, have your blood pressure checked every 3-5 years. If you are 1 years of age or older, have your blood pressure checked every year. You should have your blood pressure measured twice--once when you are at a hospital or clinic, and once when you are not at a hospital or clinic. Record the average of the two measurements. To check your blood pressure when you are not at a hospital or clinic, you can use:  An automated blood pressure machine at a pharmacy.  A home blood pressure monitor.  If you are between 46 years and 47 years old, ask your health  care provider if you should take aspirin to prevent strokes.  Have regular diabetes screenings. This involves taking a blood sample to check your fasting blood sugar level.  If you are at a normal weight and have a low risk for diabetes, have this test once every three years after 52 years of age.  If you are overweight and have a high risk for diabetes, consider being tested at a younger age or more often. PREVENTING INFECTION  Hepatitis B  If you have a higher risk for hepatitis B, you should be screened for this virus. You are considered at high risk for hepatitis B if:  You were born in a country where hepatitis B is common. Ask your health care provider which countries are considered high risk.  Your parents were born in a high-risk country, and you have not been immunized against hepatitis B (hepatitis B vaccine).  You have HIV or AIDS.  You use needles to inject street drugs.  You live with someone who has hepatitis B.  You have had sex with someone who has hepatitis B.  You get hemodialysis treatment.  You take certain medicines for conditions, including cancer, organ transplantation, and autoimmune conditions. Hepatitis C  Blood testing is recommended for:  Everyone born from [birth year] through 1967.  Anyone with known risk factors for hepatitis C. Sexually transmitted infections (STIs)  You should be screened for sexually transmitted infections (STIs) including gonorrhea and chlamydia if:  You are sexually active and are younger than 52 years of age.  You are older than 52 years of age and your health care provider tells you that you are at risk for this type of infection.  Your sexual activity has changed since you were last screened and you are at an increased risk for chlamydia or gonorrhea. Ask your health care provider if you are at risk.  If you do not have HIV, but are at risk, it may be recommended that you take a prescription medicine daily to prevent HIV  infection. This is called pre-exposure prophylaxis (PrEP). You are considered at risk if:  You are sexually active and do not regularly use condoms or know the HIV status of your partner(s).  You take drugs by injection.  You are sexually active with a partner who has HIV. Talk with your health care provider about whether you are at high risk of being infected with HIV. If you choose to begin PrEP, you should first  be tested for HIV. You should then be tested every 3 months for as long as you are taking PrEP.  PREGNANCY   If you are premenopausal and you may become pregnant, ask your health care provider about preconception counseling.  If you may become pregnant, take 400 to 800 micrograms (mcg) of folic acid every day.  If you want to prevent pregnancy, talk to your health care provider about birth control (contraception). OSTEOPOROSIS AND MENOPAUSE   Osteoporosis is a disease in which the bones lose minerals and strength with aging. This can result in serious bone fractures. Your risk for osteoporosis can be identified using a bone density scan.  If you are 65 years of age or older, or if you are at risk for osteoporosis and fractures, ask your health care provider if you should be screened.  Ask your health care provider whether you should take a calcium or vitamin D supplement to lower your risk for osteoporosis.  Menopause may have certain physical symptoms and risks.  Hormone replacement therapy may reduce some of these symptoms and risks. Talk to your health care provider about whether hormone replacement therapy is right for you.  HOME CARE INSTRUCTIONS   Schedule regular health, dental, and eye exams.  Stay current with your immunizations.   Do not use any tobacco products including cigarettes, chewing tobacco, or electronic cigarettes.  If you are pregnant, do not drink alcohol.  If you are breastfeeding, limit how much and how often you drink alcohol.  Limit  alcohol intake to no more than 1 drink per day for nonpregnant women. One drink equals 12 ounces of beer, 5 ounces of wine, or 1 ounces of hard liquor.  Do not use street drugs.  Do not share needles.  Ask your health care provider for help if you need support or information about quitting drugs.  Tell your health care provider if you often feel depressed.  Tell your health care provider if you have ever been abused or do not feel safe at home.   This information is not intended to replace advice given to you by your health care provider. Make sure you discuss any questions you have with your health care provider.   Document Released: 02/25/2011 Document Revised: 09/02/2014 Document Reviewed: 07/14/2013 Elsevier Interactive Patient Education 2016 [employer]  

## 2016-02-20 LAB — HIV ANTIBODY (ROUTINE TESTING W REFLEX): HIV 1&2 Ab, 4th Generation: NONREACTIVE

## 2016-02-20 LAB — HEPATITIS C ANTIBODY: HCV Ab: NEGATIVE

## 2016-02-23 ENCOUNTER — Encounter: Payer: Self-pay | Admitting: Family Medicine

## 2016-02-23 ENCOUNTER — Other Ambulatory Visit: Payer: Self-pay | Admitting: Family Medicine

## 2016-02-23 DIAGNOSIS — R7301 Impaired fasting glucose: Secondary | ICD-10-CM

## 2016-03-07 ENCOUNTER — Telehealth: Payer: Self-pay | Admitting: Family Medicine

## 2016-03-07 NOTE — Telephone Encounter (Signed)
Pt is calling to make a lab appointment. She said she was told to come back in and have a finger prick to check her blood. The are no orders in Epic. Can we put orders in and let patient so that she make an appointment. She also said that if she didn't answer we could leave a voice mail. jw

## 2016-03-08 NOTE — Telephone Encounter (Signed)
Called patient to schedule lab visit, however, she states she has decided not to get this done.

## 2016-05-16 NOTE — Progress Notes (Deleted)
50 y.o. G93P1001 Married [place] American female here for annual exam.    PCP:     No LMP recorded.           Sexually active: {yes no:314532}  The current method of family planning is {contraception:315051}.    Exercising: {yes no:314532}  {types:19826} Smoker:  {YES NO:22349}  Health Maintenance: Pap:  05-05-15 Neg:Neg HR HPV History of abnormal Pap:  Yes,  AGUS:Neg HR HPV;colposcopy ECC negative, Exocervix negative and Endometrial biopsy negative. MMG:  02-14-16 Density B/Neg/BiRads1:[facility] Colonoscopy:  07/2012 polyp with Dr. Verdell Face due 07/2017. BMD:   n/a  Result  n/a TDaP:  09/2010 Gardasil:   N/A HIV: Hep C: Screening Labs:  Hb today: ***, Urine today: ***   reports that she has never smoked. She has never used smokeless tobacco. She reports that she does not drink alcohol or use drugs.  Past Medical History:  Diagnosis Date  . Abnormal Pap smear of cervix 01-31-14   --AGUS pap with neg HR HPV with [facility]  . Fibroid   . Personal history of adenomatous colonic polyp 08/18/2012   07/2012 - 5 mm adenoma    Past Surgical History:  Procedure Laterality Date  . no prior surgery      No current outpatient prescriptions on file.   No current facility-administered medications for this visit.     Family History  Problem Relation Age of Onset  . Diabetes Mother   . Hypertension Mother   . Cancer Mother     Colon, diagnosed late 71s  . Colon cancer Mother 23  . Cancer Father   . Stomach cancer Father 46  . Diabetes Sister   . Hypertension Sister   . Seizures Brother   . Diabetes Brother   . Cancer Brother     Dec lung CA at 74    ROS:  Pertinent items are noted in HPI.  Otherwise, a comprehensive ROS was negative.  Exam:   There were no vitals taken for this visit.    General appearance: alert, cooperative and appears stated age Head: Normocephalic, without obvious abnormality, atraumatic Neck: no adenopathy, supple, symmetrical,  trachea midline and thyroid normal to inspection and palpation Lungs: clear to auscultation bilaterally Breasts: normal appearance, no masses or tenderness, No nipple retraction or dimpling, No nipple discharge or bleeding, No axillary or supraclavicular adenopathy Heart: regular rate and rhythm Abdomen: soft, non-tender; no masses, no organomegaly Extremities: extremities normal, atraumatic, no cyanosis or edema Skin: Skin color, texture, turgor normal. No rashes or lesions Lymph nodes: Cervical, supraclavicular, and axillary nodes normal. No abnormal inguinal nodes palpated Neurologic: Grossly normal  Pelvic: External genitalia:  no lesions              Urethra:  normal appearing urethra with no masses, tenderness or lesions              Bartholins and Skenes: normal                 Vagina: normal appearing vagina with normal color and discharge, no lesions              Cervix: no lesions              Pap taken: {yes no:314532} Bimanual Exam:  Uterus:  normal size, contour, position, consistency, mobility, non-tender              Adnexa: no mass, fullness, tenderness  Rectal exam: {yes no:314532}.  Confirms.              Anus:  normal sphincter tone, no lesions  Chaperone was present for exam.  Assessment:   Well woman visit with normal exam.   Plan: Yearly mammogram recommended after age 53.  Recommended self breast exam.  Pap and HR HPV as above. Discussed Calcium, Vitamin D, regular exercise program including cardiovascular and weight bearing exercise.   Follow up annually and prn.   Additional counseling given.  {yes [id number]. _______ minutes face to face time of which over 50% was spent in counseling.    After visit summary provided.

## 2016-05-22 ENCOUNTER — Ambulatory Visit: Payer: BLUE CROSS/BLUE SHIELD | Admitting: Obstetrics and Gynecology

## 2016-06-05 ENCOUNTER — Ambulatory Visit: Payer: BLUE CROSS/BLUE SHIELD | Admitting: Obstetrics and Gynecology

## 2016-06-14 ENCOUNTER — Ambulatory Visit (INDEPENDENT_AMBULATORY_CARE_PROVIDER_SITE_OTHER): Payer: BLUE CROSS/BLUE SHIELD | Admitting: Obstetrics and Gynecology

## 2016-06-14 ENCOUNTER — Encounter: Payer: Self-pay | Admitting: Obstetrics and Gynecology

## 2016-06-14 VITALS — BP 138/82 | HR 100 | Resp 18 | Ht 59.0 in | Wt 165.0 lb

## 2016-06-14 DIAGNOSIS — Z01419 Encounter for gynecological examination (general) (routine) without abnormal findings: Secondary | ICD-10-CM

## 2016-06-14 DIAGNOSIS — Z0000 Encounter for general adult medical examination without abnormal findings: Secondary | ICD-10-CM | POA: Diagnosis not present

## 2016-06-14 DIAGNOSIS — Z1151 Encounter for screening for human papillomavirus (HPV): Secondary | ICD-10-CM | POA: Diagnosis not present

## 2016-06-14 LAB — POCT URINALYSIS DIPSTICK
Bilirubin, UA: NEGATIVE
Blood, UA: NEGATIVE
Glucose, UA: NEGATIVE
KETONES UA: NEGATIVE
LEUKOCYTES UA: NEGATIVE
NITRITE UA: NEGATIVE
PH UA: 5
PROTEIN UA: NEGATIVE
Urobilinogen, UA: NEGATIVE

## 2016-06-14 NOTE — Patient Instructions (Signed)

## 2016-06-14 NOTE — Progress Notes (Signed)
50 y.o. G49P1001 Married [place] American female here for annual exam.    Regular menses.  Some hot flashes.  Sometimes feels very cold. This is rare.  PCP:  Chrisandra Netters, MD   Patient's last menstrual period was 06/04/2016 (exact date).     Period Cycle (Days): 30 Period Duration (Days): 4 (spots 2 days prior to 2 days after) Period Pattern: Regular Menstrual Flow: Moderate Menstrual Control: Maxi pad Menstrual Control Change Freq (Hours): 3-4 hours Dysmenorrhea: None     Sexually active: Yes.  female  The current method of family planning is condoms everytime.    Exercising: No.  Occ.walking and work out on [employer] Smoker:  no  Health Maintenance: Pap:  05-05-15 Neg:Neg HR HPV History of abnormal Pap 02/01/14:  Yes, AGUS:Neg HR HPV;colposcopy ECC negative, Exocervix negative and Endometrial biopsy negative. MMG:  02-14-16 Density D/Neg/BiRads1:[facility] Colonoscopy:  07/2012 polyp with Dr. Carlean Purl.  Next due 07/2017. BMD:   n/a  Result  n/a TDaP:  09/2010 Gardasil:   N/A HIV:  Negative on 02/19/16. Hep C:  Negative on 02/19/16. Screening Labs:  Hb today: PCP, Urine today: Neg   reports that she has never smoked. She has never used smokeless tobacco. She reports that she does not drink alcohol or use drugs.  Past Medical History:  Diagnosis Date  . Abnormal Pap smear of cervix 01-31-14   --AGUS pap with neg HR HPV with [facility]  . Fibroid   . Personal history of adenomatous colonic polyp 08/18/2012   07/2012 - 5 mm adenoma    Past Surgical History:  Procedure Laterality Date  . no prior surgery      No current outpatient prescriptions on file.   No current facility-administered medications for this visit.     Family History  Problem Relation Age of Onset  . Diabetes Mother   . Hypertension Mother   . Cancer Mother     Colon, diagnosed late 102s  . Colon cancer Mother 32  . Cancer Father   . Stomach cancer Father 67  . Diabetes Sister   .  Hypertension Sister   . Seizures Brother   . Diabetes Brother   . Cancer Brother     Dec lung CA at 14    ROS:  Pertinent items are noted in HPI.  Otherwise, a comprehensive ROS was negative.  Exam:   BP 138/82 (BP Location: Right Arm, Patient Position: Sitting, Cuff Size: Normal)   Pulse 100   Resp 18   Ht 4\' 11"  (1.499 m)   Wt 165 lb (74.8 kg)   LMP 06/04/2016 (Exact Date)   BMI 33.33 kg/m     General appearance: alert, cooperative and appears stated age Head: Normocephalic, without obvious abnormality, atraumatic Neck: no adenopathy, supple, symmetrical, trachea midline and thyroid normal to inspection and palpation Lungs: clear to auscultation bilaterally Breasts: normal appearance, no masses or tenderness, No nipple retraction or dimpling, No nipple discharge or bleeding, No axillary or supraclavicular adenopathy Heart: regular rate and rhythm Abdomen: soft, non-tender; no masses, no organomegaly Extremities: extremities normal, atraumatic, no cyanosis or edema Skin: Skin color, texture, turgor normal. No rashes or lesions Lymph nodes: Cervical, supraclavicular, and axillary nodes normal. No abnormal inguinal nodes palpated Neurologic: Grossly normal  Pelvic: External genitalia:  no lesions              Urethra:  normal appearing urethra with no masses, tenderness or lesions  Bartholins and Skenes: normal                 Vagina: normal appearing vagina with normal color and discharge, no lesions              Cervix: no lesions              Pap taken: Yes.   Bimanual Exam:  Uterus: 7- 8 week size  , contour, position, consistency, mobility, non-tender              Adnexa: no mass, fullness, tenderness              Rectal exam: Yes.  .  Confirms.              Anus:  normal sphincter tone, no lesions  Chaperone was present for exam.  Assessment:   Well woman visit with normal exam. Hx AGUS pap and negative work up.  Fibroid uterus.   Plan: Yearly  mammogram recommended after age 74.  Recommended self breast exam.  Pap and HR HPV as above. Discussed Calcium, Vitamin D, regular exercise program including cardiovascular and weight bearing exercise.   Follow up annually and prn.      After visit summary provided.

## 2016-06-20 ENCOUNTER — Telehealth: Payer: Self-pay

## 2016-06-20 LAB — IPS PAP TEST WITH HPV

## 2016-06-20 NOTE — Telephone Encounter (Signed)
Patient is returning your call.  

## 2016-06-20 NOTE — Telephone Encounter (Signed)
-----   Message from Nunzio Cobbs, MD sent at 06/20/2016 10:27 AM EDT ----- Please inform patient of her negative pap and negative HR HPV.  By protocol, she has had two normal paps following her AGUS pap.  This does allow [place] to space out her pap smears now to every 3 years.    I do recommend yearly well woman visits!  Recall - 02.

## 2016-06-20 NOTE — Telephone Encounter (Signed)
Called patient at [phone] to discuss pap results. Left message to call me back.

## 2016-06-21 NOTE — Telephone Encounter (Signed)
Patient notified of pap results. She has AEX appointment 07-02-17.

## 2016-06-21 NOTE — Telephone Encounter (Signed)
Patient returning call.

## 2016-08-21 ENCOUNTER — Ambulatory Visit (INDEPENDENT_AMBULATORY_CARE_PROVIDER_SITE_OTHER): Payer: BLUE CROSS/BLUE SHIELD | Admitting: Family Medicine

## 2016-08-21 ENCOUNTER — Encounter: Payer: Self-pay | Admitting: Family Medicine

## 2016-08-21 DIAGNOSIS — M542 Cervicalgia: Secondary | ICD-10-CM | POA: Diagnosis not present

## 2016-08-21 MED ORDER — CYCLOBENZAPRINE HCL 10 MG PO TABS: 10.0000 mg | ORAL_TABLET | Freq: Two times a day (BID) | ORAL | 0 refills | Status: DC | PRN

## 2016-08-21 MED ORDER — NAPROXEN SODIUM 220 MG PO CAPS: 220.0000 mg | ORAL_CAPSULE | Freq: Two times a day (BID) | ORAL | 0 refills | Status: DC | PRN

## 2016-08-21 NOTE — Assessment & Plan Note (Signed)
Patient is here with complaints of right-sided neck and upper back pain. Etiology currently unknown however signs and symptoms most consistent with muscle strain and associated spasms. At this time I cannot rule out the possibility of cervical radiculopathy as some of her history seems consistent with this, however physical exam yielded reproducible muscle tenderness and a negative Spurling's test.  - Over-the-counter Aleve for 20 mg twice a day 5 days then 1 tablet twice a day as needed - Flexeril 10 mg twice a day when necessary - Encouraged improved posture - Ice/heat as needed to the affected areas  Next: If patient's symptoms persist and/or symptoms seemed to sway more towards radiculopathy consider x-ray/MRI of the cervical spine.

## 2016-08-21 NOTE — Patient Instructions (Signed)
It was a pleasure seeing you today in our clinic. Today we discussed your neck and shoulder pain. Here is the treatment plan we have discussed and agreed upon together:   - Heat packs and/or ice packs may help at the end of the long day when applied directly to your neck and sore areas. - Take over-the-counter Aleve.  - 2 tablets twice a day for 5 days  - Then, 1 tablet twice a day as needed after that. - I have written you a prescription for Flexeril. Take 1 tablet as needed for these muscle spasms up to twice a day. - As discussed in clinic today do your best to work on your posture by keeping your back straight and head up. - You should notice improvement in your symptoms over the next 2 weeks. If you're pain worsens or he did not notice any improvement in 2-3 weeks come back for reevaluation.

## 2016-08-21 NOTE — Progress Notes (Signed)
   HPI  CC: head and neck pain, Left Starts at back of neck Radiates to shoulder/scapula/ant chest.  No injury/fall No numbness Some tightness/heaviness of the arm muscles occasionally. First noticed 2 months ago. Acutely worse 2 weeks ago. Seems to be worse on days that she works No weakness or swelling No erythema or ecchymoses  Review of Systems    See HPI for ROS. All other systems reviewed and are negative.  CC, SH/smoking status, and VS noted  Objective: BP 132/70   Pulse 71   Temp 98.6 F (37 C) (Oral)   Wt 164 lb (74.4 kg)   BMI 33.12 kg/m  Gen: NAD, alert, cooperative, and pleasant. HEENT: NCAT, Neck full ROM CV: RRR, no murmur Resp: CTAB, no wheezes, non-labored MSK: No rash/erythema/ecchymoses/bony deformity. ROM of neck and bilateral shoulder intact. Reproducible tenderness noted along the right posterior neck, trapezius, scapula, and anterior superior chest wall. Rotator cuff unremarkable. Negative Spurling's test. Strength 5/5 throughout. Neuro: Alert and oriented, sensation intact throughout, Speech clear, No gross deficits  Assessment and plan:  Neck pain on right side Patient is here with complaints of right-sided neck and upper back pain. Etiology currently unknown however signs and symptoms most consistent with muscle strain and associated spasms. At this time I cannot rule out the possibility of cervical radiculopathy as some of her history seems consistent with this, however physical exam yielded reproducible muscle tenderness and a negative Spurling's test.  - Over-the-counter Aleve for 20 mg twice a day 5 days then 1 tablet twice a day as needed - Flexeril 10 mg twice a day when necessary - Encouraged improved posture - Ice/heat as needed to the affected areas  Next: If patient's symptoms persist and/or symptoms seemed to sway more towards radiculopathy consider x-ray/MRI of the cervical spine.   Meds ordered this encounter  Medications  .  cyclobenzaprine (FLEXERIL) 10 MG tablet    Sig: Take 1 tablet (10 mg total) by mouth 2 (two) times daily as needed for muscle spasms.    Dispense:  30 tablet    Refill:  0  . Naproxen Sodium (ALEVE) 220 MG CAPS    Sig: Take 1 capsule (220 mg total) by mouth 2 (two) times daily as needed.    Dispense:  60 each    Refill:  0     Elberta Leatherwood, MD,MS,  PGY3 08/21/2016 5:43 PM

## 2017-02-10 ENCOUNTER — Other Ambulatory Visit: Payer: Self-pay | Admitting: Family Medicine

## 2017-02-10 DIAGNOSIS — Z1231 Encounter for screening mammogram for malignant neoplasm of breast: Secondary | ICD-10-CM

## 2017-02-18 ENCOUNTER — Ambulatory Visit
Admission: RE | Admit: 2017-02-18 | Discharge: 2017-02-18 | Disposition: A | Discharge status: Home / Self Care | Payer: BLUE CROSS/BLUE SHIELD | Source: Ambulatory Visit | Attending: Family Medicine | Admitting: Family Medicine

## 2017-02-18 DIAGNOSIS — Z1231 Encounter for screening mammogram for malignant neoplasm of breast: Secondary | ICD-10-CM | POA: Diagnosis not present

## 2017-06-30 NOTE — Progress Notes (Signed)
51 y.o. G5P1001 Married [place] American female here for annual exam.    Wants a pap today.   Menses in September spotting off and on for one week.  Normal menses in October.  No skipped menses.  Having some hot flashes.   ROS - abdominal bloating that is episodic.   Has an appointment with PCP in December 2018.  PCP: Chrisandra Netters    Patient's last menstrual period was 06/13/2017 (exact date).           Sexually active: Yes.    The current method of family planning is condoms everytime.    Exercising: No.   Smoker:  no  Health Maintenance: Pap: 06-14-16 Neg:Neg HR HPV,05-05-15 Neg:Neg HR HPV History of abnormal Pap:  Yes, 2015 AGUS:Neg HR HPV;colposcopy ECC negative, Exocervix negative and Endometrial biopsy negative. MMG:  02-18-17 Density D/Neg/BiRads1:[facility] Colonoscopy:  07/2012 polyp with Dr. Carlean Purl. Next due 07/2017. BMD:   n/a  Result  n/a TDaP:  09/2010 Gardasil:   no HIV:02-19-16 NR Hep C:02-19-16 Neg Screening Labs:  Hb today: PCP, Urine today: not done   reports that  has never smoked. she has never used smokeless tobacco. She reports that she does not drink alcohol or use drugs.  Past Medical History:  Diagnosis Date  . Abnormal Pap smear of cervix 01-31-14   --AGUS pap with neg HR HPV with [facility]  . Fibroid   . Personal history of adenomatous colonic polyp 08/18/2012   07/2012 - 5 mm adenoma    Past Surgical History:  Procedure Laterality Date  . no prior surgery      No current outpatient medications on file.   No current facility-administered medications for this visit.     Family History  Problem Relation Age of Onset  . Diabetes Mother   . Hypertension Mother   . Cancer Mother        Colon, diagnosed late 38s  . Colon cancer Mother 88  . Cancer Father   . Stomach cancer Father 40  . Diabetes Sister   . Hypertension Sister   . Seizures Brother   . Diabetes Brother   . Diabetes Brother   . Cancer Brother        Dec lung CA at  69    ROS:  Pertinent items are noted in HPI.  Otherwise, a comprehensive ROS was negative.  Exam:   BP 122/70 (BP Location: Left Arm, Patient Position: Sitting, Cuff Size: Normal)   Pulse 88   Resp 18   Ht 4\' 11"  (1.499 m)   Wt 164 lb (74.4 kg)   LMP 06/13/2017 (Exact Date)   BMI 33.12 kg/m     General appearance: alert, cooperative and appears stated age Head: Normocephalic, without obvious abnormality, atraumatic Neck: no adenopathy, supple, symmetrical, trachea midline and thyroid normal to inspection and palpation Lungs: clear to auscultation bilaterally Breasts: normal appearance, no masses or tenderness, No nipple retraction or dimpling, No nipple discharge or bleeding, No axillary or supraclavicular adenopathy Heart: regular rate and rhythm Abdomen: soft, non-tender; no masses, no organomegaly Extremities: extremities normal, atraumatic, no cyanosis or edema Skin: Skin color, texture, turgor normal. No rashes or lesions Lymph nodes: Cervical, supraclavicular, and axillary nodes normal. No abnormal inguinal nodes palpated Neurologic: Grossly normal  Pelvic: External genitalia:  no lesions              Urethra:  normal appearing urethra with no masses, tenderness or lesions  Bartholins and Skenes: normal                 Vagina: normal appearing vagina with normal color and discharge, no lesions              Cervix: no lesions              Pap taken: Yes.   Bimanual Exam:  Uterus:   7 week size, feels more right sided, nontender.              Adnexa: no mass, fullness, tenderness              Rectal exam: Yes.  .  Confirms.              Anus:  normal sphincter tone, no lesions  Chaperone was present for exam.  Assessment:   Well woman visit with normal exam. Hx prior AGUS pap and negative work up.  Fibroid uterus.  FH of colon, stomach, and lung cancer.   Plan: Mammogram yearly.  Discussed 3D. Recommended self breast awareness. Pap and HR HPV as  above. Guidelines for Calcium, Vitamin D, regular exercise program including cardiovascular and weight bearing exercise. She will call if no cycles for 3 months.  Follow up annually and prn.     After visit summary provided.

## 2017-07-02 ENCOUNTER — Ambulatory Visit (INDEPENDENT_AMBULATORY_CARE_PROVIDER_SITE_OTHER): Payer: BLUE CROSS/BLUE SHIELD | Admitting: Obstetrics and Gynecology

## 2017-07-02 ENCOUNTER — Other Ambulatory Visit: Payer: Self-pay

## 2017-07-02 ENCOUNTER — Other Ambulatory Visit (HOSPITAL_COMMUNITY)
Admission: RE | Admit: 2017-07-02 | Discharge: 2017-07-02 | Disposition: A | Discharge status: Home / Self Care | Payer: BLUE CROSS/BLUE SHIELD | Source: Ambulatory Visit | Attending: Obstetrics and Gynecology | Admitting: Obstetrics and Gynecology

## 2017-07-02 ENCOUNTER — Encounter: Payer: Self-pay | Admitting: Obstetrics and Gynecology

## 2017-07-02 VITALS — BP 122/70 | HR 88 | Resp 18 | Ht 59.0 in | Wt 164.0 lb

## 2017-07-02 DIAGNOSIS — Z01419 Encounter for gynecological examination (general) (routine) without abnormal findings: Secondary | ICD-10-CM | POA: Insufficient documentation

## 2017-07-02 NOTE — Patient Instructions (Signed)

## 2017-07-04 LAB — CYTOLOGY - PAP
DIAGNOSIS: NEGATIVE
HPV: NOT DETECTED

## 2017-07-22 ENCOUNTER — Encounter: Payer: Self-pay | Admitting: Internal Medicine

## 2017-08-01 ENCOUNTER — Ambulatory Visit (INDEPENDENT_AMBULATORY_CARE_PROVIDER_SITE_OTHER): Payer: BLUE CROSS/BLUE SHIELD | Admitting: Family Medicine

## 2017-08-01 ENCOUNTER — Other Ambulatory Visit: Payer: Self-pay

## 2017-08-01 ENCOUNTER — Ambulatory Visit (HOSPITAL_COMMUNITY)
Admission: RE | Admit: 2017-08-01 | Discharge: 2017-08-01 | Disposition: A | Discharge status: Home / Self Care | Payer: BLUE CROSS/BLUE SHIELD | Source: Ambulatory Visit | Attending: Family Medicine | Admitting: Family Medicine

## 2017-08-01 ENCOUNTER — Encounter: Payer: Self-pay | Admitting: Family Medicine

## 2017-08-01 VITALS — BP 134/82 | HR 97 | Temp 98.5°F | Ht 59.0 in | Wt 164.4 lb

## 2017-08-01 DIAGNOSIS — Z01419 Encounter for gynecological examination (general) (routine) without abnormal findings: Secondary | ICD-10-CM

## 2017-08-01 DIAGNOSIS — R7309 Other abnormal glucose: Secondary | ICD-10-CM

## 2017-08-01 DIAGNOSIS — R079 Chest pain, unspecified: Secondary | ICD-10-CM

## 2017-08-01 DIAGNOSIS — Z13220 Encounter for screening for lipoid disorders: Secondary | ICD-10-CM | POA: Diagnosis not present

## 2017-08-01 LAB — POCT GLYCOSYLATED HEMOGLOBIN (HGB A1C): HEMOGLOBIN A1C: 5.2

## 2017-08-01 NOTE — Progress Notes (Signed)
Date of Visit: 08/01/2017   HPI:  Patient presents today for a well woman exam.   Concerns today: see below GYN health: has upcoming appointment with GYN to address Exercise: sometimes Smoking:  no Alcohol: rarely Drugs: no Mood: denies depressive symptoms, feels safe in her relationships  Has had some chest pain that comes and goes. Happens a few times a week. Not associated with sweating or nausea. Lasts about 10 minutes at a time. Also has some discomfort in her left shoulder & arm. Not worse with exertion. Nonsmoker. No family history of cardiac issues. No chest pain right now.  Has some gas-like discomfort in her left upper quadrant as well. Has upcoming appointment for 5 year repeat screening colonoscopy.  ROS: See HPI  [place]:  No family history of cardiac issues  PHYSICAL EXAM: BP 134/82   Pulse 97   Temp 98.5 F (36.9 C) (Axillary)   Ht 4\' 11"  (1.499 m)   Wt 164 lb 6.4 oz (74.6 kg)   SpO2 99%   BMI 33.20 kg/m  Gen: NAD, pleasant, cooperative HEENT: NCAT, PERRL, no palpable thyromegaly or anterior cervical lymphadenopathy Heart: RRR, no murmurs. Chest pain not reproducible with palpation. Lungs: CTAB, NWOB Abdomen: soft, nontender to palpation Neuro: grossly nonfocal, speech normal  ASSESSMENT/PLAN:  Health maintenance:  -mammogram: UTD -lipid screening: lipids today -immunizations: declines flu vaccine today -colon cancer screening: has appointment for repeat colonoscopy -pap smear: gets at GYN, UTD -diabetes screening: check A1c given prior elevated fasting glucose -handout given on health maintenance topics  Chest pain: largely atypical but with no prior cardiac workup. EKG today without acute ischemic changes. No chest pain at this time. Will refer to cardiology for evaluation. Discussed chest pain return precautions. Check lipids today for risk stratification.  FOLLOW UP: Follow up in 1 year for next physical Referring to cardiology  Samantha Key, [place]

## 2017-08-01 NOTE — Patient Instructions (Addendum)
It was great to see you again today!  I am referring you to a heart doctor for your chest pain. You will get a phone call to schedule this appointment.   Checking labs today -will call or send letter  If you get chest pain that does not go away within 15-20 mins or is especially bad please go immediately to the ER.  Follow up with me in 1 year for your next physical, sooner if anything changes.  Be well, Dr. Ardelia Mems

## 2017-08-02 LAB — CMP14+EGFR
ALK PHOS: 59 IU/L (ref 39–117)
ALT: 14 IU/L (ref 0–32)
AST: 15 IU/L (ref 0–40)
Albumin/Globulin Ratio: 1.3 (ref 1.2–2.2)
Albumin: 4.4 g/dL (ref 3.5–5.5)
BUN/Creatinine Ratio: 15 (ref 9–23)
BUN: 11 mg/dL (ref 6–24)
Bilirubin Total: 0.4 mg/dL (ref 0.0–1.2)
CO2: 24 mmol/L (ref 20–29)
CREATININE: 0.74 mg/dL (ref 0.57–1.00)
Calcium: 9.5 mg/dL (ref 8.7–10.2)
Chloride: 103 mmol/L (ref 96–106)
GFR calc Af Amer: 107 mL/min/{1.73_m2} (ref >59)
GFR calc non Af Amer: 93 mL/min/{1.73_m2} (ref >59)
GLUCOSE: 96 mg/dL (ref 65–99)
Globulin, Total: 3.4 g/dL (ref 1.5–4.5)
Potassium: 4.2 mmol/L (ref 3.5–5.2)
SODIUM: 140 mmol/L (ref 134–144)
Total Protein: 7.8 g/dL (ref 6.0–8.5)

## 2017-08-02 LAB — LIPID PANEL
CHOL/HDL RATIO: 2.5 ratio (ref 0.0–4.4)
Cholesterol, Total: 166 mg/dL (ref 100–199)
HDL: 67 mg/dL (ref >39)
LDL CALC: 90 mg/dL (ref 0–99)
Triglycerides: 47 mg/dL (ref 0–149)
VLDL CHOLESTEROL CAL: 9 mg/dL (ref 5–40)

## 2017-08-12 ENCOUNTER — Encounter: Payer: Self-pay | Admitting: Family Medicine

## 2017-08-28 ENCOUNTER — Telehealth: Payer: Self-pay | Admitting: *Deleted

## 2017-08-28 NOTE — Telephone Encounter (Signed)
Patient was seen by Dr. Ardelia Mems 08/01/17 with complaints of atypical chest pain. EKG without ischemic changes. However, pt was referred to cardiology. In order to proceed with colonoscopy, pt will need documentation of cardiac clearance. Called and informed patient that PV and colonoscopy appointments are being cancelled until documentation can be provided. Pt states full understanding and will call back to reschedule appointments once she had further work up.

## 2017-09-01 ENCOUNTER — Telehealth: Payer: Self-pay | Admitting: Family Medicine

## 2017-09-01 NOTE — Telephone Encounter (Signed)
I sent her a letter, not sure why she did not get it. Results were all normal. Please let patient know. Thanks, Leeanne Rio, MD

## 2017-09-01 NOTE — Telephone Encounter (Signed)
Pt informed. Deseree Blount, CMA  

## 2017-09-01 NOTE — Telephone Encounter (Signed)
Did not receive results from her physical.

## 2017-09-17 ENCOUNTER — Encounter: Payer: BLUE CROSS/BLUE SHIELD | Admitting: Internal Medicine

## 2017-09-25 ENCOUNTER — Ambulatory Visit (INDEPENDENT_AMBULATORY_CARE_PROVIDER_SITE_OTHER): Payer: BLUE CROSS/BLUE SHIELD | Admitting: Interventional Cardiology

## 2017-09-25 ENCOUNTER — Encounter: Payer: Self-pay | Admitting: Interventional Cardiology

## 2017-09-25 VITALS — BP 134/88 | HR 98 | Ht 60.0 in | Wt 167.6 lb

## 2017-09-25 DIAGNOSIS — R0789 Other chest pain: Secondary | ICD-10-CM | POA: Diagnosis not present

## 2017-09-25 NOTE — Patient Instructions (Signed)
Medication Instructions:  Your physician recommends that you continue on your current medications as directed. Please refer to the Current Medication list given to you today.   Labwork: None ordered  Testing/Procedures: None ordered  Follow-Up: Your physician wants you to follow-up AS NEEDED  Any Other Special Instructions Will Be Listed Below (If Applicable).     If you need a refill on your cardiac medications before your next appointment, please call your pharmacy.   

## 2017-09-25 NOTE — Progress Notes (Signed)
Cardiology Office Note   Date:  09/25/2017   ID:  Samantha Key, DOB 1965-10-26, MRN [id number]  PCP:  Leeanne Rio, MD    No chief complaint on file.  Chest pain  Wt Readings from Last 3 Encounters:  09/25/17 167 lb 9.6 oz (76 kg)  08/01/17 164 lb 6.4 oz (74.6 kg)  07/02/17 164 lb (74.4 kg)       History of Present Illness: Samantha Key is a 52 y.o. female who is being seen today for the evaluation of chest pain at the request of Ardelia Mems Delorse Limber, MD.  During a visit, she mentioned some chest discomfort to her PMD.  She thinks it was gas.  It is a pressure that moves and resolves with burping.  ECG was normal at that time in Dec 2018.  Pain can also resolve when she stretches her arms.  No prior stress testing.    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  She does not walk up stairs.  No relationship between any activity as a cause of the pain.  It lasts just a few seconds at a time.    No family h/o CAD.        Past Medical History:  Diagnosis Date  . Abnormal Pap smear of cervix 01-31-14   --AGUS pap with neg HR HPV with [facility]  . Fibroid   . Personal history of adenomatous colonic polyp 08/18/2012   07/2012 - 5 mm adenoma    Past Surgical History:  Procedure Laterality Date  . no prior surgery       No current outpatient medications on file.   No current facility-administered medications for this visit.     Allergies:   Patient has no known allergies.    Social History:  The patient  reports that  has never smoked. she has never used smokeless tobacco. She reports that she does not drink alcohol or use drugs.   Family History:  The patient's family history includes Cancer in her brother, father, and mother; Colon cancer (age of onset: 30) in her mother; Diabetes in her brother, brother, mother, and sister; Hypertension in her mother and sister; Seizures in her brother; Stomach cancer (age of onset: 40) in her  father.    ROS:  Please see the history of present illness.   Otherwise, review of systems are positive for chest pain as noted above.   All other systems are reviewed and negative.    PHYSICAL EXAM: VS:  BP 134/88 (BP Location: Left Arm, Patient Position: Sitting, Cuff Size: Normal)   Pulse 98   Ht 5' (1.524 m)   Wt 167 lb 9.6 oz (76 kg)   SpO2 99%   BMI 32.73 kg/m  , BMI Body mass index is 32.73 kg/m. GEN: Well nourished, well developed, in no acute distress  HEENT: normal  Neck: no JVD, carotid bruits, or masses Cardiac: RRR; no murmurs, rubs, or gallops,no edema  Respiratory:  clear to auscultation bilaterally, normal work of breathing GI: soft, nontender, nondistended, + BS MS: no deformity or atrophy  Skin: warm and dry, no rash Neuro:  Strength and sensation are intact Psych: euthymic mood, full affect   EKG:   The ekg ordered Dec 2018 demonstrates was normal   Recent Labs: 08/01/2017: ALT 14; BUN 11; Creatinine, Ser 0.74; Potassium 4.2; Sodium 140   Lipid Panel    Component Value Date/Time   CHOL 166 08/01/2017  1148   TRIG 47 08/01/2017 1148   HDL 67 08/01/2017 1148   CHOLHDL 2.5 08/01/2017 1148   CHOLHDL 2.4 02/19/2016 0937   VLDL 13 02/19/2016 0937   LDLCALC 90 08/01/2017 1148     Other studies Reviewed: Additional studies/ records that were reviewed today with results demonstrating: labs reviewed: LDL 90 in 12/18.   ASSESSMENT AND PLAN:  1. Chest pain: Atypical symptoms.  Not related to exertion.  I doubt this is cardiac, and I think it is likely more musculoskeletal or GI.  Her ECG is normal.  We discussed stress testing to be on the safe side.  She feels like the symptoms have been there for a long time and if they were serious, she would have had a problem by now.  There is no change in the intensity or frequency of the symptoms.  She would like to hold off on any further testing.  I encouraged her that if her symptoms get more frequent or severe, she  should contact our office and we would pursue stress testing.  She is in agreement. 2. She would benefit from increased exercise.  I encouraged a healthy diet.      Current medicines are reviewed at length with the patient today.  The patient concerns regarding her medicines were addressed.  The following changes have been made:  No change  Labs/ tests ordered today include:  No orders of the defined types were placed in this encounter.   Recommend 150 minutes/week of aerobic exercise Low fat, low carb, high fiber diet recommended  Disposition:   FU as needed   Signed, Larae Grooms, MD  09/25/2017 10:26 AM    [place] [address], [place], [place]  [address] Phone: [phone]; Fax: [phone]

## 2017-10-01 ENCOUNTER — Encounter: Payer: Self-pay | Admitting: Internal Medicine

## 2018-01-30 ENCOUNTER — Ambulatory Visit (AMBULATORY_SURGERY_CENTER): Payer: Self-pay | Admitting: *Deleted

## 2018-01-30 ENCOUNTER — Other Ambulatory Visit: Payer: Self-pay

## 2018-01-30 VITALS — Ht 59.0 in | Wt 167.8 lb

## 2018-01-30 DIAGNOSIS — Z800 Family history of malignant neoplasm of digestive organs: Secondary | ICD-10-CM

## 2018-01-30 DIAGNOSIS — Z86010 Personal history of colonic polyps: Secondary | ICD-10-CM

## 2018-01-30 NOTE — Progress Notes (Signed)
Denies allergies to eggs or soy products. Denies complications with sedation or anesthesia. Denies O2 use. Denies use of diet or weight loss medications.  Emmi instructions given for colonoscopy.  

## 2018-02-02 ENCOUNTER — Encounter: Payer: Self-pay | Admitting: Internal Medicine

## 2018-02-12 ENCOUNTER — Other Ambulatory Visit: Payer: Self-pay

## 2018-02-12 ENCOUNTER — Encounter: Payer: Self-pay | Admitting: Internal Medicine

## 2018-02-12 ENCOUNTER — Ambulatory Visit (AMBULATORY_SURGERY_CENTER): Payer: BLUE CROSS/BLUE SHIELD | Admitting: Internal Medicine

## 2018-02-12 VITALS — BP 144/95 | HR 55 | Temp 98.9°F | Resp 16 | Ht 59.0 in | Wt 167.0 lb

## 2018-02-12 DIAGNOSIS — Z1211 Encounter for screening for malignant neoplasm of colon: Secondary | ICD-10-CM

## 2018-02-12 DIAGNOSIS — D123 Benign neoplasm of transverse colon: Secondary | ICD-10-CM

## 2018-02-12 DIAGNOSIS — D125 Benign neoplasm of sigmoid colon: Secondary | ICD-10-CM | POA: Diagnosis not present

## 2018-02-12 DIAGNOSIS — Z86010 Personal history of colonic polyps: Secondary | ICD-10-CM | POA: Diagnosis not present

## 2018-02-12 MED ORDER — SODIUM CHLORIDE 0.9 % IV SOLN
500.0000 mL | Freq: Once | INTRAVENOUS | Status: DC
Start: 2018-02-12 — End: 2018-08-07

## 2018-02-12 NOTE — Progress Notes (Signed)
Pt's states no medical or surgical changes since previsit or office visit. 

## 2018-02-12 NOTE — Op Note (Signed)
[facility] Patient Name: Samantha Key Procedure Date: 02/12/2018 2:30 PM MRN: [id number] Endoscopist: Gatha Mayer , MD Age: 52 Referring MD:  Date of Birth: [birth year] Gender: Female Account #: [id number] Procedure:                Colonoscopy Indications:              Surveillance: Personal history of adenomatous                            polyps on last colonoscopy > 5 years ago, Family                            history of colon cancer in a first-degree relative Medicines:                Propofol per Anesthesia, Monitored Anesthesia Care Procedure:                Pre-Anesthesia Assessment:                           - Prior to the procedure, a History and Physical                            was performed, and patient medications and                            allergies were reviewed. The patient's tolerance of                            previous anesthesia was also reviewed. The risks                            and benefits of the procedure and the sedation                            options and risks were discussed with the patient.                            All questions were answered, and informed consent                            was obtained. Prior Anticoagulants: The patient has                            taken no previous anticoagulant or antiplatelet                            agents. ASA Grade Assessment: II - A patient with                            mild systemic disease. After reviewing the risks                            and benefits, the patient was deemed in  satisfactory condition to undergo the procedure.                           After obtaining informed consent, the colonoscope                            was passed under direct vision. Throughout the                            procedure, the patient's blood pressure, pulse, and                            oxygen saturations were monitored continuously. The             Colonoscope was introduced through the anus and                            advanced to the the cecum, identified by                            appendiceal orifice and ileocecal valve. The                            colonoscopy was somewhat difficult due to                            significant looping. Successful completion of the                            procedure was aided by applying abdominal pressure.                            The patient tolerated the procedure well. The                            quality of the bowel preparation was excellent. The                            ileocecal valve, appendiceal orifice, and rectum                            were photographed. Scope In: 2:45:42 PM Scope Out: 3:00:45 PM Scope Withdrawal Time: 0 hours 11 minutes 5 seconds  Total Procedure Duration: 0 hours 15 minutes 3 seconds  Findings:                 The perianal and digital rectal examinations were                            normal.                           Two sessile polyps were found in the sigmoid colon                            and transverse  colon. The polyps were diminutive in                            size. These polyps were removed with a cold snare.                            Resection and retrieval were complete. Verification                            of patient identification for the specimen was                            done. Estimated blood loss was minimal.                           The exam was otherwise without abnormality on                            direct and retroflexion views. Complications:            No immediate complications. Estimated Blood Loss:     Estimated blood loss was minimal. Impression:               - Two diminutive polyps in the sigmoid colon and in                            the transverse colon, removed with a cold snare.                            Resected and retrieved.                           - Personal history of colonic  polyp adenoma 2013                            and mom had CRCA in 78's. Recommendation:           - Patient has a contact number available for                            emergencies. The signs and symptoms of potential                            delayed complications were discussed with the                            patient. Return to normal activities tomorrow.                            Written discharge instructions were provided to the                            patient.                           - Resume  previous diet.                           - Continue present medications.                           - Repeat colonoscopy is recommended for                            surveillance. The colonoscopy date will be                            determined after pathology results from today's                            exam become available for review. Gatha Mayer, MD 02/12/2018 3:12:40 PM This report has been signed electronically.

## 2018-02-12 NOTE — Progress Notes (Signed)
Called to room to assist during endoscopic procedure.  Patient ID and intended procedure confirmed with present staff. Received instructions for my participation in the procedure from the performing physician.  

## 2018-02-12 NOTE — Patient Instructions (Addendum)
   I found and removed 2 tiny polyps today. No signs of cancer.  I appreciate the opportunity to care for you. Gatha Mayer, MD, FACG  YOU HAD AN ENDOSCOPIC PROCEDURE TODAY AT [facility]:   Refer to the procedure report that was given to you for any specific questions about what was found during the examination.  If the procedure report does not answer your questions, please call your gastroenterologist to clarify.  If you requested that your care partner not be given the details of your procedure findings, then the procedure report has been included in a sealed envelope for you to review at your convenience later.  YOU SHOULD EXPECT: Some feelings of bloating in the abdomen. Passage of more gas than usual.  Walking can help get rid of the air that was put into your GI tract during the procedure and reduce the bloating. If you had a lower endoscopy (such as a colonoscopy or flexible sigmoidoscopy) you may notice spotting of blood in your stool or on the toilet paper. If you underwent a bowel prep for your procedure, you may not have a normal bowel movement for a few days.  Please Note:  You might notice some irritation and congestion in your nose or some drainage.  This is from the oxygen used during your procedure.  There is no need for concern and it should clear up in a day or so.  SYMPTOMS TO REPORT IMMEDIATELY:   Following lower endoscopy (colonoscopy or flexible sigmoidoscopy):  Excessive amounts of blood in the stool  Significant tenderness or worsening of abdominal pains  Swelling of the abdomen that is new, acute  Fever of 100F or higher  For urgent or emergent issues, a gastroenterologist can be reached at any hour by calling [phone].   DIET:  We do recommend a small meal at first, but then you may proceed to your regular diet.  Drink plenty of fluids but you should avoid alcoholic beverages for 24 hours.  ACTIVITY:  You should plan to take it  easy for the rest of today and you should NOT DRIVE or use heavy machinery until tomorrow (because of the sedation medicines used during the test).    FOLLOW UP: Our staff will call the number listed on your records the next business day following your procedure to check on you and address any questions or concerns that you may have regarding the information given to you following your procedure. If we do not reach you, we will leave a message.  However, if you are feeling well and you are not experiencing any problems, there is no need to return our call.  We will assume that you have returned to your regular daily activities without incident.  If any biopsies were taken you will be contacted by phone or by letter within the next 1-3 weeks.  Please call us at [phone] if you have not heard about the biopsies in 3 weeks.    SIGNATURES/CONFIDENTIALITY: You and/or your care partner have signed paperwork which will be entered into your electronic medical record.  These signatures attest to the fact that that the information above on your After Visit Summary has been reviewed and is understood.  Full responsibility of the confidentiality of this discharge information lies with you and/or your care-partner.

## 2018-02-12 NOTE — Progress Notes (Signed)
Pt alert and oriented x 3, report given to [employer]

## 2018-02-13 ENCOUNTER — Telehealth: Payer: Self-pay | Admitting: *Deleted

## 2018-02-13 NOTE — Telephone Encounter (Signed)
  Follow up Call-  Call back number 02/12/2018  Post procedure Call Back phone  # [phone]  Permission to leave phone message Yes  Some recent data might be hidden     Patient questions:  Do you have a fever, pain , or abdominal swelling? No. Pain Score  0 *  Have you tolerated food without any problems? Yes.    Have you been able to return to your normal activities? Yes.    Do you have any questions about your discharge instructions: Diet   No. Medications  No. Follow up visit  No.  Do you have questions or concerns about your Care? No.  Actions: * If pain score is 4 or above: No action needed, pain <4.

## 2018-02-23 ENCOUNTER — Encounter: Payer: Self-pay | Admitting: Internal Medicine

## 2018-02-23 DIAGNOSIS — Z86010 Personal history of colonic polyps: Secondary | ICD-10-CM

## 2018-02-23 NOTE — Progress Notes (Signed)
Diminutive adenomas x 2 Recall colon 2024

## 2018-02-24 ENCOUNTER — Other Ambulatory Visit: Payer: Self-pay | Admitting: Family Medicine

## 2018-02-24 DIAGNOSIS — Z1231 Encounter for screening mammogram for malignant neoplasm of breast: Secondary | ICD-10-CM

## 2018-03-20 ENCOUNTER — Telehealth: Payer: Self-pay | Admitting: Family Medicine

## 2018-03-20 NOTE — Telephone Encounter (Signed)
Pt was contacted about the paperwork she was referring to. It was AVS that was printed at [facility]  6-19.  Pt was advised to contact [facility] and have this corrected there.

## 2018-03-20 NOTE — Telephone Encounter (Signed)
Pt says her paperwork shows her mother had cancer in 1999.  Her age needs to be corrected  35 rather than 11.

## 2018-03-24 ENCOUNTER — Ambulatory Visit: Payer: BLUE CROSS/BLUE SHIELD

## 2018-03-27 ENCOUNTER — Ambulatory Visit
Admission: RE | Admit: 2018-03-27 | Discharge: 2018-03-27 | Disposition: A | Discharge status: Home / Self Care | Payer: BLUE CROSS/BLUE SHIELD | Source: Ambulatory Visit | Attending: Family Medicine | Admitting: Family Medicine

## 2018-03-27 DIAGNOSIS — Z1231 Encounter for screening mammogram for malignant neoplasm of breast: Secondary | ICD-10-CM | POA: Diagnosis not present

## 2018-07-28 NOTE — Progress Notes (Signed)
52 y.o. G10P1001 Married [place] American female here for annual exam.    Patient complaining of hot flashes. Had 4 - 5 menses this year.   States pain in her right kidney area.  Comes and goes.   Traveling for Christmas, [place].  Will see PCP on 08/07/18. Will do labs there.  PCP:  Chrisandra Netters, MD   Patient's last menstrual period was 06/24/2018 (exact date).     Period Pattern: (!) Irregular Menstrual Flow: Moderate Menstrual Control: Maxi pad Dysmenorrhea: None     Sexually active: Yes.    The current method of family planning is condoms everytime.    Exercising: Yes.    walking at work Smoker:  no  Health Maintenance: Pap: 07-02-17 Neg:Neg HR HPV, 06-14-16 Neg:Neg HR HPV History of abnormal Pap:  Yes,  2015 AGUS:Neg HR HPV;colposcopy ECC negative, Exocervix negative and Endometrial biopsy negative. MMG: 03-27-18 3D Neg/density C/BiRads1 Colonoscopy: 02-12-18 polyps;next due 01/2023 BMD:   n/a  Result  n/a TDaP:  10-19-10 Gardasil:   no HIV: 02-19-16 NR Hep C: 02-19-16 Neg Screening Labs:  Hb today: PCP    reports that she has never smoked. She has never used smokeless tobacco. She reports that she does not drink alcohol or use drugs.  Past Medical History:  Diagnosis Date  . Abnormal Pap smear of cervix 01-31-14   --AGUS pap with neg HR HPV with [facility]  . Fibroid   . Personal history of adenomatous colonic polyp 08/18/2012   07/2012 - 5 mm adenoma    Past Surgical History:  Procedure Laterality Date  . COLONOSCOPY  07/2012, 01/2018    Current Outpatient Medications  Medication Sig Dispense Refill  . B Complex-C (SUPER B COMPLEX PO) Take by mouth.    . Cholecalciferol (VITAMIN D3 PO) Take by mouth.    . multivitamin-iron-minerals-folic acid (CENTRUM) chewable tablet Chew 1 tablet by mouth daily.    . vitamin C (ASCORBIC ACID) 500 MG tablet Take 500 mg by mouth daily.    Marland Kitchen zinc gluconate 50 MG tablet Take 50 mg by mouth daily.     Current  Facility-Administered Medications  Medication Dose Route Frequency Provider Last Rate Last Dose  . 0.9 %  sodium chloride infusion  500 mL Intravenous Once Gatha Mayer, MD        Family History  Problem Relation Age of Onset  . Diabetes Mother   . Hypertension Mother   . Cancer Mother        Colon, diagnosed late 43s  . Colon cancer Mother 46  . Cancer Father   . Stomach cancer Father 23  . Diabetes Sister   . Hypertension Sister   . Seizures Brother   . Diabetes Brother   . Diabetes Brother   . Cancer Brother        Dec lung CA at 54  . Esophageal cancer Neg Hx   . Rectal cancer Neg Hx     Review of Systems  Musculoskeletal: Positive for back pain.       Mid back pain at times  All other systems reviewed and are negative.   Exam:   BP (!) 142/98 (BP Location: Right Arm, Patient Position: Sitting, Cuff Size: Normal)   Pulse 88   Resp 14   Ht 4' 10.5" (1.486 m)   Wt 162 lb (73.5 kg)   LMP 06/24/2018 (Exact Date)   BMI 33.28 kg/m     General appearance: alert, cooperative and appears stated  age Head: Normocephalic, without obvious abnormality, atraumatic Neck: no adenopathy, supple, symmetrical, trachea midline and thyroid normal to inspection and palpation Lungs: clear to auscultation bilaterally Breasts: normal appearance, no masses or tenderness, No nipple retraction or dimpling, No nipple discharge or bleeding, No axillary or supraclavicular adenopathy Heart: regular rate and rhythm Abdomen: soft, non-tender; no masses, no organomegaly Extremities: extremities normal, atraumatic, no cyanosis or edema Skin: Skin color, texture, turgor normal. No rashes or lesions Lymph nodes: Cervical, supraclavicular, and axillary nodes normal. No abnormal inguinal nodes palpated Neurologic: Grossly normal  Pelvic: External genitalia:  no lesions              Urethra:  normal appearing urethra with no masses, tenderness or lesions              Bartholins and Skenes: normal                  Vagina: normal appearing vagina with normal color and discharge, no lesions              Cervix: no lesions              Pap taken: Yes.   Bimanual Exam:  Uterus:  normal size, contour, position, consistency, mobility, non-tender              Adnexa: no mass, fullness, tenderness              Rectal exam: Yes.  .  Confirms.              Anus:  normal sphincter tone, no lesions  Chaperone was present for exam.  Assessment:   Well woman visit with normal exam. Hx prior AGUS pap and negative work up.  Fibroid uterus.  FH of colon, stomach, and lung cancer.  Menopausal symptoms.   Plan: Mammogram screening. Recommended self breast awareness. Pap and HR HPV as above. Guidelines for Calcium, Vitamin D, regular exercise program including cardiovascular and weight bearing exercise. She will call if no menses for 3 months.  She declines potential Provera.  May check FSH and E2 levels instead. Discussed herbal remedies. Brochure and discussion about menopause.   Follow up annually and prn.   After visit summary provided.

## 2018-07-29 ENCOUNTER — Ambulatory Visit (INDEPENDENT_AMBULATORY_CARE_PROVIDER_SITE_OTHER): Payer: BLUE CROSS/BLUE SHIELD | Admitting: Obstetrics and Gynecology

## 2018-07-29 ENCOUNTER — Other Ambulatory Visit (HOSPITAL_COMMUNITY)
Admission: RE | Admit: 2018-07-29 | Discharge: 2018-07-29 | Disposition: A | Discharge status: Home / Self Care | Payer: BLUE CROSS/BLUE SHIELD | Source: Ambulatory Visit | Attending: Obstetrics and Gynecology | Admitting: Obstetrics and Gynecology

## 2018-07-29 ENCOUNTER — Other Ambulatory Visit: Payer: Self-pay

## 2018-07-29 ENCOUNTER — Encounter: Payer: Self-pay | Admitting: Obstetrics and Gynecology

## 2018-07-29 VITALS — BP 142/98 | HR 88 | Resp 14 | Ht 58.5 in | Wt 162.0 lb

## 2018-07-29 DIAGNOSIS — Z1151 Encounter for screening for human papillomavirus (HPV): Secondary | ICD-10-CM | POA: Diagnosis not present

## 2018-07-29 DIAGNOSIS — Z01419 Encounter for gynecological examination (general) (routine) without abnormal findings: Secondary | ICD-10-CM | POA: Diagnosis not present

## 2018-07-29 NOTE — Patient Instructions (Signed)
EXERCISE AND DIET:  We recommended that you start or continue a regular exercise program for good health. Regular exercise means any activity that makes your heart beat faster and makes you sweat.  We recommend exercising at least 30 minutes per day at least 3 days a week, preferably 4 or 5.  We also recommend a diet low in fat and sugar.  Inactivity, poor dietary choices and obesity can cause diabetes, heart attack, stroke, and kidney damage, among others.    ALCOHOL AND SMOKING:  Women should limit their alcohol intake to no more than 7 drinks/beers/glasses of wine (combined, not each!) per week. Moderation of alcohol intake to this level decreases your risk of breast cancer and liver damage. And of course, no recreational drugs are part of a healthy lifestyle.  And absolutely no smoking or even second hand smoke. Most people know smoking can cause heart and lung diseases, but did you know it also contributes to weakening of your bones? Aging of your skin?  Yellowing of your teeth and nails?  CALCIUM AND VITAMIN D:  Adequate intake of calcium and Vitamin D are recommended.  The recommendations for exact amounts of these supplements seem to change often, but generally speaking 600 mg of calcium (either carbonate or citrate) and 800 units of Vitamin D per day seems prudent. Certain women may benefit from higher intake of Vitamin D.  If you are among these women, your doctor will have told you during your visit.    PAP SMEARS:  Pap smears, to check for cervical cancer or precancers,  have traditionally been done yearly, although recent scientific advances have shown that most women can have pap smears less often.  However, every woman still should have a physical exam from her gynecologist every year. It will include a breast check, inspection of the vulva and vagina to check for abnormal growths or skin changes, a visual exam of the cervix, and then an exam to evaluate the size and shape of the uterus and  ovaries.  And after 54 years of age, a rectal exam is indicated to check for rectal cancers. We will also provide age appropriate advice regarding health maintenance, like when you should have certain vaccines, screening for sexually transmitted diseases, bone density testing, colonoscopy, mammograms, etc.   MAMMOGRAMS:  All women over 40 years old should have a yearly mammogram. Many facilities now offer a "3D" mammogram, which may cost around $50 extra out of pocket. If possible,  we recommend you accept the option to have the 3D mammogram performed.  It both reduces the number of women who will be called back for extra views which then turn out to be normal, and it is better than the routine mammogram at detecting truly abnormal areas.    COLONOSCOPY:  Colonoscopy to screen for colon cancer is recommended for all women at age 50.  We know, you hate the idea of the prep.  We agree, BUT, having colon cancer and not knowing it is worse!!  Colon cancer so often starts as a polyp that can be seen and removed at colonscopy, which can quite literally save your life!  And if your first colonoscopy is normal and you have no family history of colon cancer, most women don't have to have it again for 10 years.  Once every ten years, you can do something that may end up saving your life, right?  We will be happy to help you get it scheduled when you are ready.    Be sure to check your insurance coverage so you understand how much it will cost.  It may be covered as a preventative service at no cost, but you should check your particular policy.     Menopause and Herbal Products What is menopause? Menopause is the normal time of life when menstrual periods decrease in frequency and eventually stop completely. This process can take several years for some women. Menopause is complete when you have had an absence of menstruation for a full year since your last menstrual period. It usually occurs between the ages of 52 and  46. It is not common for menopause to begin before the age of 48. During menopause, your body stops producing the female hormones estrogen and progesterone. Common symptoms associated with this loss of hormones (vasomotor symptoms) are:  Hot flashes.  Hot flushes.  Night sweats.  Other common symptoms and complications of menopause include:  Decrease in sex drive.  Vaginal dryness and thinning of the walls of the vagina. This can make sex painful.  Dryness of the skin and development of wrinkles.  Headaches.  Tiredness.  Irritability.  Memory problems.  Weight gain.  Bladder infections.  Hair growth on the face and chest.  Inability to reproduce offspring (infertility).  Loss of density in the bones (osteoporosis) increasing your risk for breaks (fractures).  Depression.  Hardening and narrowing of the arteries (atherosclerosis). This increases your risk of heart attack and stroke.  What treatment options are available? There are many treatment choices for menopause symptoms. The most common treatment is hormone replacement therapy. Many alternative therapies for menopause are emerging, including the use of herbal products. These supplements can be found in the form of herbs, teas, oils, tinctures, and pills. Common herbal supplements for menopause are made from plants that contain phytoestrogens. Phytoestrogens are compounds that occur naturally in plants and plant products. They act like estrogen in the body. Foods and herbs that contain phytoestrogens include:  Soy.  Flax seeds.  Red clover.  Ginseng.  What menopause symptoms may be helped if I use herbal products?  Vasomotor symptoms. These may be helped by: ? Soy. Some studies show that soy may have a moderate benefit for hot flashes. ? Black cohosh. There is limited evidence indicating this may be beneficial for hot flashes.  Symptoms that are related to heart and blood vessel disease. These may be  helped by soy. Studies have shown that soy can help to lower cholesterol.  Depression. This may be helped by: ? St. John's wort. There is limited evidence that shows this may help mild to moderate depression. ? Black cohosh. There is evidence that this may help depression and mood swings.  Osteoporosis. Soy may help to decrease bone loss that is associated with menopause and may prevent osteoporosis. Limited evidence indicates that red clover may offer some bone loss protection as well. Other herbal products that are commonly used during menopause lack enough evidence to support their use as a replacement for conventional menopause therapies. These products include evening primrose, ginseng, and red clover. What are the cases when herbal products should not be used during menopause? Do not use herbal products during menopause without your health care provider's approval if:  You are taking medicine.  You have a preexisting liver condition.  Are there any risks in my taking herbal products during menopause? If you choose to use herbal products to help with symptoms of menopause, keep in mind that:  Different supplements have different and unmeasured  amounts of herbal ingredients.  Herbal products are not regulated the same way that medicines are.  Concentrations of herbs may vary depending on the way they are prepared. For example, the concentration may be different in a pill, tea, oil, and tincture.  Little is known about the risks of using herbal products, particularly the risks of long-term use.  Some herbal supplements can be harmful when combined with certain medicines.  Most commonly reported side effects of herbal products are mild. However, if used improperly, many herbal supplements can cause serious problems. Talk to your health care provider before starting any herbal product. If problems develop, stop taking the supplement and let your health care provider know. This  information is not intended to replace advice given to you by your health care provider. Make sure you discuss any questions you have with your health care provider. Document Released: 01/29/2008 Document Revised: 07/09/2016 Document Reviewed: 01/25/2014 Elsevier Interactive Patient Education  2017 [employer].

## 2018-07-31 LAB — CYTOLOGY - PAP
DIAGNOSIS: NEGATIVE
HPV (WINDOPATH): NOT DETECTED

## 2018-08-07 ENCOUNTER — Other Ambulatory Visit: Payer: Self-pay

## 2018-08-07 ENCOUNTER — Ambulatory Visit (INDEPENDENT_AMBULATORY_CARE_PROVIDER_SITE_OTHER): Payer: BLUE CROSS/BLUE SHIELD | Admitting: Family Medicine

## 2018-08-07 ENCOUNTER — Encounter: Payer: Self-pay | Admitting: Family Medicine

## 2018-08-07 VITALS — BP 122/88 | HR 97 | Temp 98.8°F | Ht 58.5 in | Wt 163.0 lb

## 2018-08-07 DIAGNOSIS — Z0000 Encounter for general adult medical examination without abnormal findings: Secondary | ICD-10-CM | POA: Diagnosis not present

## 2018-08-07 DIAGNOSIS — Z23 Encounter for immunization: Secondary | ICD-10-CM

## 2018-08-07 LAB — POCT GLYCOSYLATED HEMOGLOBIN (HGB A1C): Hemoglobin A1C: 5.5 % (ref 4.0–5.6)

## 2018-08-07 NOTE — Progress Notes (Signed)
Date of Visit: 08/07/2018   HPI:  Patient presents today for a well woman exam.   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 She mostly wants reassurance it is not her kidney. No urinary symptoms.  Periods: irregular, gets hot flashes, some spotting in between periods but says she has discussed this with GYN at her recent visit there. Last period in October. Contraception: condoms Pelvic symptoms: no vag discharge or pelvic pain Sexual activity: with husband STD Screening: declines Pap smear status: UTD, had at GYN Exercise: sometimes Smoking: no Alcohol: occasional Drugs: no Mood: no concerns for depression Dentist: goes twice a year  ROS: See HPI  [place]:  Cancers in family:  Mom had colon cancer Dad had stomach cancer Brother had lung cancer, was a smoker  PHYSICAL EXAM: BP 122/88   Pulse 97   Temp 98.8 F (37.1 C) (Oral)   Ht 4' 10.5" (1.486 m)   Wt 163 lb (73.9 kg)   SpO2 99%   BMI 33.49 kg/m  Gen: NAD, pleasant, cooperative HEENT: NCAT, PERRL, no palpable thyromegaly or anterior cervical lymphadenopathy Heart: RRR, no murmurs Lungs: CTAB, NWOB Abdomen: soft, nontender to palpation Neuro: grossly nonfocal, speech normal Back: mild tenderness of paraspinal musculature of R lower mid back  ASSESSMENT/PLAN:  Health maintenance:  -STD screening: declines -pap smear: UTD -mammogram: UTD -lipid screening: check lipids, CMET, A1c today today -DEXA: n/a -immunizations: flu shot given today -handout given on health maintenance topics  Back soreness - reassured patient this is not her kidney, likely musculoskeletal . Recommend gentle back stretching  FOLLOW UP: Follow up in 1 year for routine health maintenance visit.  [place]. Ardelia Mems, [place]

## 2018-08-07 NOTE — Patient Instructions (Signed)

## 2018-08-08 LAB — CMP14+EGFR
ALT: 17 IU/L (ref 0–32)
AST: 16 IU/L (ref 0–40)
Albumin/Globulin Ratio: 1.4 (ref 1.2–2.2)
Albumin: 4.4 g/dL (ref 3.5–5.5)
Alkaline Phosphatase: 62 IU/L (ref 39–117)
BUN / CREAT RATIO: 15 (ref 9–23)
BUN: 10 mg/dL (ref 6–24)
Bilirubin Total: 0.3 mg/dL (ref 0.0–1.2)
CO2: 26 mmol/L (ref 20–29)
Calcium: 10.1 mg/dL (ref 8.7–10.2)
Chloride: 103 mmol/L (ref 96–106)
Creatinine, Ser: 0.68 mg/dL (ref 0.57–1.00)
GFR calc Af Amer: 115 mL/min/{1.73_m2} (ref >59)
GFR calc non Af Amer: 99 mL/min/{1.73_m2} (ref >59)
GLOBULIN, TOTAL: 3.2 g/dL (ref 1.5–4.5)
Glucose: 94 mg/dL (ref 65–99)
Potassium: 4.4 mmol/L (ref 3.5–5.2)
Sodium: 143 mmol/L (ref 134–144)
Total Protein: 7.6 g/dL (ref 6.0–8.5)

## 2018-08-08 LAB — LIPID PANEL
Chol/HDL Ratio: 2.8 ratio (ref 0.0–4.4)
Cholesterol, Total: 195 mg/dL (ref 100–199)
HDL: 70 mg/dL (ref >39)
LDL Calculated: 110 mg/dL — ABNORMAL HIGH (ref 0–99)
Triglycerides: 76 mg/dL (ref 0–149)
VLDL Cholesterol Cal: 15 mg/dL (ref 5–40)

## 2018-08-14 ENCOUNTER — Encounter: Payer: Self-pay | Admitting: Family Medicine

## 2019-03-01 ENCOUNTER — Other Ambulatory Visit: Payer: Self-pay | Admitting: Family Medicine

## 2019-03-01 DIAGNOSIS — Z1231 Encounter for screening mammogram for malignant neoplasm of breast: Secondary | ICD-10-CM

## 2019-04-09 ENCOUNTER — Ambulatory Visit
Admission: RE | Admit: 2019-04-09 | Discharge: 2019-04-09 | Disposition: A | Discharge status: Home / Self Care | Payer: BC Managed Care – PPO | Source: Ambulatory Visit | Attending: Family Medicine | Admitting: Family Medicine

## 2019-04-09 ENCOUNTER — Other Ambulatory Visit: Payer: Self-pay

## 2019-04-09 DIAGNOSIS — Z1231 Encounter for screening mammogram for malignant neoplasm of breast: Secondary | ICD-10-CM | POA: Diagnosis not present

## 2019-05-28 DIAGNOSIS — H524 Presbyopia: Secondary | ICD-10-CM | POA: Diagnosis not present

## 2019-07-14 ENCOUNTER — Other Ambulatory Visit: Payer: Self-pay

## 2019-07-14 ENCOUNTER — Telehealth (INDEPENDENT_AMBULATORY_CARE_PROVIDER_SITE_OTHER): Payer: BC Managed Care – PPO | Admitting: Family Medicine

## 2019-07-14 DIAGNOSIS — R42 Dizziness and giddiness: Secondary | ICD-10-CM | POA: Insufficient documentation

## 2019-07-14 MED ORDER — MECLIZINE HCL 12.5 MG PO TABS: 12.5000 mg | ORAL_TABLET | Freq: Three times a day (TID) | ORAL | 0 refills | Status: DC | PRN

## 2019-07-14 MED ORDER — FLUTICASONE PROPIONATE 50 MCG/ACT NA SUSP: 1.0000 | Freq: Every day | NASAL | 12 refills | Status: DC

## 2019-07-14 NOTE — Progress Notes (Signed)
[place] Telemedicine Visit  Patient consented to have virtual visit. Method of visit: Video was attempted, but technology challenges prevented patient from using video, so visit was conducted via telephone.  Encounter participants: Patient: Samantha Key - located at home in [facility] Provider: Nuala Alpha - located at [facility] Others (if applicable): none  Chief Complaint: Dizziness  HPI: Patient is a 53y/o female with PMH of palpitations who presents today for an episode of dizziness and subsequent vomiting that occurred when she woke up from sleep on Monday morning. She did not feel like the room was spinning. She denies any trauma or fall to the head or neck. She had a normal night and normal morning just woke up dizzy and then had vomiting. It has since gotten better with rest as she stayed home that day. It came "out of nowhere" and she has had this before about 4 years ago. She state she was given meclizine and it improved her symptoms. She also has some nasal congestion.  ROS: per HPI  Pertinent PMHx: Palpitations  Exam:  Respiratory: speaks in normal sentences  Assessment/Plan:  Dizziness Acute self resolved case of dizziness. No classic vertigo symptoms. Unclear as to the cause but seems like it could be vestibular in nature - Meclizine prn    Time spent during visit with patient: >15 minutes  Harolyn Rutherford, [place], PGY-3

## 2019-07-14 NOTE — Assessment & Plan Note (Signed)
Acute self resolved case of dizziness. No classic vertigo symptoms. Unclear as to the cause but seems like it could be vestibular in nature - Meclizine prn

## 2019-08-02 ENCOUNTER — Other Ambulatory Visit: Payer: Self-pay

## 2019-08-03 NOTE — Progress Notes (Signed)
53 y.o. G40P1001 Married [place] American female here for annual exam.    Patient complaining of hot flashes and sweating. States she can deal with hot flashes.  No vaginal dryness.  States she had menses 7 times this year.  Last anywhere for from 1 - 4 days.   She has a rash under her breast.  Feels heat under her breast.  She does want a pap today.  Sister in law had Covid and is doing well.   Labs with PCP.  PCP: Chrisandra Netters, MD    Patient's last menstrual period was 03/27/2019 (approximate).     Period Pattern: (!) Irregular     Sexually active: Yes.    The current method of family planning is condoms everytime.    Exercising: Yes.    Walking Smoker:  no  Health Maintenance: Pap: 07-29-18 Neg:Neg HR HPV, 07-02-17 Neg:Neg HR HPV, 06-14-16 Neg:Neg HR HPV History of abnormal Pap:  Yes,2015AGUS:Neg HR HPV;colposcopy ECC negative, Exocervix negative and Endometrial biopsy negative.  MMG: 04-09-19 3D/Neg/density C/BiRads1 Colonoscopy: 02-12-18 polyps;next due 01/2023 BMD:   n/a  Result  n/a TDaP:  10-19-10 Gardasil:   no HIV:02-19-16 NR Hep C:02-19-16 Neg Screening Labs:  Flu vaccine completed.   reports that she has never smoked. She has never used smokeless tobacco. She reports that she does not drink alcohol or use drugs.  Past Medical History:  Diagnosis Date  . Abnormal Pap smear of cervix 01-31-14   --AGUS pap with neg HR HPV with [facility]  . Fibroid   . Personal history of adenomatous colonic polyp 08/18/2012   07/2012 - 5 mm adenoma    Past Surgical History:  Procedure Laterality Date  . COLONOSCOPY  07/2012, 01/2018    Current Outpatient Medications  Medication Sig Dispense Refill  . B Complex-C (SUPER B COMPLEX PO) Take by mouth.    . Cholecalciferol (VITAMIN D3 PO) Take by mouth.    . fluticasone (FLONASE) 50 MCG/ACT nasal spray Place 1 spray into both nostrils daily. 1 spray in each nostril every day 16 g 12  . meclizine (ANTIVERT) 12.5 MG  tablet Take 1 tablet (12.5 mg total) by mouth 3 (three) times daily as needed for dizziness. 30 tablet 0  . multivitamin-iron-minerals-folic acid (CENTRUM) chewable tablet Chew 1 tablet by mouth daily.    . vitamin C (ASCORBIC ACID) 500 MG tablet Take 500 mg by mouth daily.    Marland Kitchen VITAMIN E PO Take by mouth.    . zinc gluconate 50 MG tablet Take 50 mg by mouth daily.     No current facility-administered medications for this visit.     Family History  Problem Relation Age of Onset  . Diabetes Mother   . Hypertension Mother   . Cancer Mother        Colon, diagnosed late 74s  . Colon cancer Mother 25  . Cancer Father   . Stomach cancer Father 80  . Diabetes Sister   . Hypertension Sister   . Seizures Brother   . Diabetes Brother   . Diabetes Brother   . Cancer Brother        Dec lung CA at 11, was a smoker  . Esophageal cancer Neg Hx   . Rectal cancer Neg Hx     Review of Systems  All other systems reviewed and are negative.   Exam:   BP 138/86 (Cuff Size: Large)   Pulse 100   Temp (!) 97.2 F (36.2 C) (Temporal)  Resp 20   Ht 4\' 11"  (1.499 m)   Wt 163 lb (73.9 kg)   LMP 03/27/2019 (Approximate)   BMI 32.92 kg/m     General appearance: alert, cooperative and appears stated age Head: normocephalic, without obvious abnormality, atraumatic Neck: no adenopathy, supple, symmetrical, trachea midline and thyroid normal to inspection and palpation Lungs: clear to auscultation bilaterally Breasts: normal appearance, no masses or tenderness, No nipple retraction or dimpling, No nipple discharge or bleeding, No axillary adenopathy Heart: regular rate and rhythm Abdomen: soft, non-tender; no masses, no organomegaly Extremities: extremities normal, atraumatic, no cyanosis or edema Skin: skin color, texture, turgor normal. No rashes or lesions Lymph nodes: cervical, supraclavicular, and axillary nodes normal. Neurologic: grossly normal  Pelvic: External genitalia:  no lesions               No abnormal inguinal nodes palpated.              Urethra:  normal appearing urethra with no masses, tenderness or lesions              Bartholins and Skenes: normal                 Vagina: normal appearing vagina with normal color and discharge, no lesions              Cervix: no lesions              Pap taken: Yes.   Bimanual Exam:  Uterus:  normal size, contour, position, consistency, mobility, non-tender.  Feels like small fibroid posterior LUS - 1.5 cm.              Adnexa: no mass, fullness, tenderness              Rectal exam: Yes.  .  Confirms.              Anus:  normal sphincter tone, no lesions  Chaperone was present for exam.  Assessment:   Well woman visit with normal exam. Hx prior AGUS pap and negative work up.  Fibroid uterus.  FH of colon, stomach, and lung cancer. Menopausal symptoms.  Candida of flexural folds.  Plan: Mammogram screening discussed. Self breast awareness reviewed. Pap and HR HPV today per patient request.  She understands she is responsible for all charges for this if her insurance does not pay.  Guidelines for Calcium, Vitamin D, regular exercise program including cardiovascular and weight bearing exercise. Nystatin powder. Lab PCP.  I discussed genetic counseling and testing.  She will consider.  Follow up annually and prn.   After visit summary provided.

## 2019-08-04 ENCOUNTER — Encounter: Payer: Self-pay | Admitting: Obstetrics and Gynecology

## 2019-08-04 ENCOUNTER — Other Ambulatory Visit: Payer: Self-pay

## 2019-08-04 ENCOUNTER — Other Ambulatory Visit (HOSPITAL_COMMUNITY)
Admission: RE | Admit: 2019-08-04 | Discharge: 2019-08-04 | Disposition: A | Discharge status: Home / Self Care | Payer: BC Managed Care – PPO | Source: Ambulatory Visit | Attending: Obstetrics and Gynecology | Admitting: Obstetrics and Gynecology

## 2019-08-04 ENCOUNTER — Ambulatory Visit (INDEPENDENT_AMBULATORY_CARE_PROVIDER_SITE_OTHER): Payer: BC Managed Care – PPO | Admitting: Obstetrics and Gynecology

## 2019-08-04 VITALS — BP 138/86 | HR 100 | Temp 97.2°F | Resp 20 | Ht 59.0 in | Wt 163.0 lb

## 2019-08-04 DIAGNOSIS — Z01419 Encounter for gynecological examination (general) (routine) without abnormal findings: Secondary | ICD-10-CM | POA: Diagnosis not present

## 2019-08-04 MED ORDER — NYSTATIN 100000 UNIT/GM EX POWD: Freq: Three times a day (TID) | CUTANEOUS | 2 refills | Status: DC

## 2019-08-04 NOTE — Patient Instructions (Signed)

## 2019-08-05 LAB — CYTOLOGY - PAP
Comment: NEGATIVE
Diagnosis: NEGATIVE
High risk HPV: NEGATIVE

## 2019-08-10 ENCOUNTER — Ambulatory Visit (INDEPENDENT_AMBULATORY_CARE_PROVIDER_SITE_OTHER): Payer: BC Managed Care – PPO | Admitting: Family Medicine

## 2019-08-10 ENCOUNTER — Other Ambulatory Visit: Payer: Self-pay

## 2019-08-10 DIAGNOSIS — Z0000 Encounter for general adult medical examination without abnormal findings: Secondary | ICD-10-CM

## 2019-08-10 DIAGNOSIS — R42 Dizziness and giddiness: Secondary | ICD-10-CM

## 2019-08-10 DIAGNOSIS — Z23 Encounter for immunization: Secondary | ICD-10-CM

## 2019-08-10 LAB — POCT GLYCOSYLATED HEMOGLOBIN (HGB A1C): Hemoglobin A1C: 5.5 % (ref 4.0–5.6)

## 2019-08-10 MED ORDER — WRIST SPLINT/COCK-UP/LEFT M MISC: 0 refills | Status: DC

## 2019-08-10 NOTE — Progress Notes (Signed)
Date of Visit: 08/10/2019   HPI:  Samantha Key presents for annual physical examination, also to discuss a few other concerns:  Bilateral upper extremity pain: Patient works as a Scientist, water quality. She notes bilateral shoulder pain which radiates down the lateral portion of her arm. In addition, she notes occasional numbness that follows the same lateral distribution while laying in bed at night. In her left hand, she notes transient pain with thumb adduction/abduction. Symptoms alleviated by rest. She has never used tylenol/ibuprofen to relieve the pain. States she believes symptoms are work related. Pain in L thumb is the most bothersome part.  Skin Lesion: Describes three well demarcated papules without erythema on her right middle phalange (1) and left thigh (2). Lesions are not painful. Each lesion has existed for 1+ year and is not changing.  CPE - would like labs today including A1c. She is fasting.   ROS: See HPI.  [place]: no significant PMHx  PHYSICAL EXAM: BP 125/80   Pulse 92   Wt 164 lb 12.8 oz (74.8 kg)   SpO2 99%   BMI 33.29 kg/m  Gen: Well appearing pleasant woman seated in chair conversing without issue HEENT: no scleral icterus, no thyromegaly or cervica lymphadenopathy Heart: regular rate and rhythm, normal s1, s2, no murmurs, rubs Lungs: normal work of breathing, clear to auscultation bilaterally, no wheezes Neuro: alert and oriented x 3, EOM intact  Ext: normal bilateral upper extremity ROM. Grip 5/5 bilaterally. + finkelsteins test on L with some mild tenderness around the radial head Skin: 1 cm well demarcated, symmetric, papule on right middle phalange with surrounding hyperpigmentation   ASSESSMENT/PLAN:  Health maintenance:  - Received flu shot during visit - Routine labs: check Lipid, A1c, CMP - otherwise UTD on HM items - handout given on health maintenance topics  Madelyn Brunner Tenosynovitis:  - Instructed to utilize a thumb spica brace with use of left hand -  follow up if not improving  Skin lesions Unclear etiology. These are small distinct papules, may be related to scar tissue from prior bug bite. Not bothersome for patient, advised we can remove them if she would like. She will think about it.  FOLLOW UP: Follow up in a year for annual physical.   Maudry Diego, [place]  Patient seen along with MS3 student Maudry Diego. I personally evaluated this patient along with the student, and verified all aspects of the history, physical exam, and medical decision making as documented by the student. I agree with the student's documentation and have made all necessary edits.  Chrisandra Netters, MD [place]

## 2019-08-10 NOTE — Patient Instructions (Addendum)
Health Maintenance, Female Adopting a healthy lifestyle and getting preventive care are important in promoting health and wellness. Ask your health care provider about:  The right schedule for you to have regular tests and exams.  Things you can do on your own to prevent diseases and keep yourself healthy. What should I know about diet, weight, and exercise? Eat a healthy diet   Eat a diet that includes plenty of vegetables, fruits, low-fat dairy products, and lean protein.  Do not eat a lot of foods that are high in solid fats, added sugars, or sodium. Maintain a healthy weight Body mass index (BMI) is used to identify weight problems. It estimates body fat based on height and weight. Your health care provider can help determine your BMI and help you achieve or maintain a healthy weight. Get regular exercise Get regular exercise. This is one of the most important things you can do for your health. Most adults should:  Exercise for at least 150 minutes each week. The exercise should increase your heart rate and make you sweat (moderate-intensity exercise).  Do strengthening exercises at least twice a week. This is in addition to the moderate-intensity exercise.  Spend less time sitting. Even light physical activity can be beneficial. Watch cholesterol and blood lipids Have your blood tested for lipids and cholesterol at 55 years of age, then have this test every 5 years. Have your cholesterol levels checked more often if:  Your lipid or cholesterol levels are high.  You are older than 55 years of age.  You are at high risk for heart disease. What should I know about cancer screening? Depending on your health history and family history, you may need to have cancer screening at various ages. This may include screening for:  Breast cancer.  Cervical cancer.  Colorectal cancer.  Skin cancer.  Lung cancer. What should I know about heart disease, diabetes, and high blood  pressure? Blood pressure and heart disease  High blood pressure causes heart disease and increases the risk of stroke. This is more likely to develop in people who have high blood pressure readings, are of African descent, or are overweight.  Have your blood pressure checked: ? Every 3-5 years if you are 54-9 years of age. ? Every year if you are 69 years old or older. Diabetes Have regular diabetes screenings. This checks your fasting blood sugar level. Have the screening done:  Once every three years after age 36 if you are at a normal weight and have a low risk for diabetes.  More often and at a younger age if you are overweight or have a high risk for diabetes. What should I know about preventing infection? Hepatitis B If you have a higher risk for hepatitis B, you should be screened for this virus. Talk with your health care provider to find out if you are at risk for hepatitis B infection. Hepatitis C Testing is recommended for:  Everyone born from [birth year] through 1967.  Anyone with known risk factors for hepatitis C. Sexually transmitted infections (STIs)  Get screened for STIs, including gonorrhea and chlamydia, if: ? You are sexually active and are younger than 55 years of age. ? You are older than 55 years of age and your health care provider tells you that you are at risk for this type of infection. ? Your sexual activity has changed since you were last screened, and you are at increased risk for chlamydia or gonorrhea. Ask your health care provider  if you are at risk.  Ask your health care provider about whether you are at high risk for HIV. Your health care provider may recommend a prescription medicine to help prevent HIV infection. If you choose to take medicine to prevent HIV, you should first get tested for HIV. You should then be tested every 3 months for as long as you are taking the medicine. Pregnancy  If you are about to stop having your period (premenopausal) and  you may become pregnant, seek counseling before you get pregnant.  Take 400 to 800 micrograms (mcg) of folic acid every day if you become pregnant.  Ask for birth control (contraception) if you want to prevent pregnancy. Osteoporosis and menopause Osteoporosis is a disease in which the bones lose minerals and strength with aging. This can result in bone fractures. If you are 61 years old or older, or if you are at risk for osteoporosis and fractures, ask your health care provider if you should:  Be screened for bone loss.  Take a calcium or vitamin D supplement to lower your risk of fractures.  Be given hormone replacement therapy (HRT) to treat symptoms of menopause. Follow these instructions at home: Lifestyle  Do not use any products that contain nicotine or tobacco, such as cigarettes, e-cigarettes, and chewing tobacco. If you need help quitting, ask your health care provider.  Do not use street drugs.  Do not share needles.  Ask your health care provider for help if you need support or information about quitting drugs. Alcohol use  Do not drink alcohol if: ? Your health care provider tells you not to drink. ? You are pregnant, may be pregnant, or are planning to become pregnant.  If you drink alcohol: ? Limit how much you use to 0-1 drink a day. ? Limit intake if you are breastfeeding.  Be aware of how much alcohol is in your drink. In the [place] one drink equals one 12 oz bottle of beer (355 mL), one 5 oz glass of wine (148 mL), or one 1 oz glass of hard liquor (44 mL). General instructions  Schedule regular health, dental, and eye exams.  Stay current with your vaccines.  Tell your health care provider if: ? You often feel depressed. ? You have ever been abused or do not feel safe at home. Summary  Adopting a healthy lifestyle and getting preventive care are important in promoting health and wellness.  Follow your health care provider's instructions about healthy  diet, exercising, and getting tested or screened for diseases.  Follow your health care provider's instructions on monitoring your cholesterol and blood pressure. This information is not intended to replace advice given to you by your health care provider. Make sure you discuss any questions you have with your health care provider. Document Released: 02/25/2011 Document Revised: 08/05/2018 Document Reviewed: 08/05/2018 Elsevier Patient Education  [place] Tenosynovitis  De Quervain's tenosynovitis is a condition that causes inflammation of the tendon on the thumb side of the wrist. Tendons are cords of tissue that connect bones to muscles. The tendons in the hand pass through a tunnel called a sheath. A slippery layer of tissue (synovium) lets the tendons move smoothly in the sheath. With de Quervain's tenosynovitis, the sheath swells or thickens, causing friction and pain. The condition is also called de Quervain's disease and de Quervain's syndrome. It occurs most often in women who are 7-75 years old. What are the causes? The exact cause  of this condition is not known. It may be associated with overuse of the hand and wrist. What increases the risk? You are more likely to develop this condition if you: Use your hands far more than normal, especially if you repeat certain movements that involve twisting your hand or using a tight grip. Are pregnant. Are a middle-aged woman. Have rheumatoid arthritis. Have diabetes. What are the signs or symptoms? The main symptom of this condition is pain on the thumb side of the wrist. The pain may get worse when you grasp something or turn your wrist. Other symptoms may include: Pain that extends up the forearm. Swelling of your wrist and hand. Trouble moving the thumb and wrist. A sensation of snapping in the wrist. A bump filled with fluid (cyst) in the area of the pain. How is this diagnosed? This condition may be  diagnosed based on: Your symptoms and medical history. A physical exam. During the exam, your health care provider may do a simple test Wynn Maudlin test) that involves pulling your thumb and wrist to see if this causes pain. You may also need to have an X-ray. How is this treated? Treatment for this condition may include: Avoiding any activity that causes pain and swelling. Taking medicines. Anti-inflammatory medicines and corticosteroid injections may be used to reduce inflammation and relieve pain. Wearing a splint. Having surgery. This may be needed if other treatments do not work. Once the pain and swelling has gone down: Physical therapy. This includes stretching and strengthening exercises. Occupational therapy. This includes adjusting how you move your wrist. Follow these instructions at home: If you have a splint: Wear the splint as told by your health care provider. Remove it only as told by your health care provider. Loosen the splint if your fingers tingle, become numb, or turn cold and blue. Keep the splint clean. If the splint is not waterproof: Do not let it get wet. Cover it with a watertight covering when you take a bath or a shower. Managing pain, stiffness, and swelling  Avoid movements and activities that cause pain and swelling in the wrist area. If directed, put ice on the painful area. This may be helpful after doing activities that involve the sore wrist. Put ice in a plastic bag. Place a towel between your skin and the bag. Leave the ice on for 20 minutes, 2-3 times a day. Move your fingers often to avoid stiffness and to lessen swelling. Raise (elevate) the injured area above the level of your heart while you are sitting or lying down. General instructions Return to your normal activities as told by your health care provider. Ask your health care provider what activities are safe for you. Take over-the-counter and prescription medicines only as told by your  health care provider. Keep all follow-up visits as told by your health care provider. This is important. Contact a health care provider if: Your pain medicine does not help. Your pain gets worse. You develop new symptoms. Summary De Quervain's tenosynovitis is a condition that causes inflammation of the tendon on the thumb side of the wrist. The condition occurs most often in women who are 41-45 years old. The exact cause of this condition is not known. It may be associated with overuse of the hand and wrist. Treatment starts with avoiding activity that causes pain or swelling in the wrist area. Other treatment may include wearing a splint and taking medicine. Sometimes, surgery is needed. This information is not intended to replace advice given  to you by your health care provider. Make sure you discuss any questions you have with your health care provider. Document Released: 05/07/2001 Document Revised: 02/12/2018 Document Reviewed: 07/21/2017 Elsevier Patient Education  2020 [employer].

## 2019-08-11 LAB — CMP14+EGFR
ALT: 15 IU/L (ref 0–32)
AST: 19 IU/L (ref 0–40)
Albumin/Globulin Ratio: 1.6 (ref 1.2–2.2)
Albumin: 4.6 g/dL (ref 3.8–4.9)
Alkaline Phosphatase: 79 IU/L (ref 39–117)
BUN/Creatinine Ratio: 13 (ref 9–23)
BUN: 9 mg/dL (ref 6–24)
Bilirubin Total: 0.3 mg/dL (ref 0.0–1.2)
CO2: 24 mmol/L (ref 20–29)
Calcium: 10.2 mg/dL (ref 8.7–10.2)
Chloride: 102 mmol/L (ref 96–106)
Creatinine, Ser: 0.67 mg/dL (ref 0.57–1.00)
GFR calc Af Amer: 114 mL/min/{1.73_m2} (ref >59)
GFR calc non Af Amer: 99 mL/min/{1.73_m2} (ref >59)
Globulin, Total: 2.9 g/dL (ref 1.5–4.5)
Glucose: 96 mg/dL (ref 65–99)
Potassium: 4.5 mmol/L (ref 3.5–5.2)
Sodium: 139 mmol/L (ref 134–144)
Total Protein: 7.5 g/dL (ref 6.0–8.5)

## 2019-08-11 LAB — LIPID PANEL
Chol/HDL Ratio: 2.8 ratio (ref 0.0–4.4)
Cholesterol, Total: 207 mg/dL — ABNORMAL HIGH (ref 100–199)
HDL: 73 mg/dL (ref >39)
LDL Chol Calc (NIH): 116 mg/dL — ABNORMAL HIGH (ref 0–99)
Triglycerides: 103 mg/dL (ref 0–149)
VLDL Cholesterol Cal: 18 mg/dL (ref 5–40)

## 2019-08-18 ENCOUNTER — Encounter: Payer: Self-pay | Admitting: Family Medicine

## 2019-11-22 ENCOUNTER — Telehealth: Payer: Self-pay

## 2019-11-22 NOTE — Telephone Encounter (Signed)
Patient called regarding questions about menopause and pregnancy.

## 2019-11-22 NOTE — Telephone Encounter (Signed)
I would recommend Plan B, as it is not possible for me to know if she is still periodically ovulating.  She needs to go for 12 consecutive months with no menstruation for her to be postmenopausal.

## 2019-11-22 NOTE — Telephone Encounter (Signed)
Spoke with patient. Patient reports LMP approximately 06/2019.  Patient has experienced hot flashes, discussed at last AEX.   Patient reports she was SA on 11/21/19, condom broke. Patient is concerned, is unsure if she is in menopause, asking if "morning after pill" an option? Does not desire pregnancy. Advised patient I will have to review with Dr. Quincy Simmonds and f/u with recommendations, patient agreeable.   Dr. Quincy Simmonds -please review and advise.

## 2019-11-22 NOTE — Telephone Encounter (Signed)
Spoke with patient, advised per Dr. Silva. Patient verbalizes understanding and is agreeable.  Encounter closed.  

## 2020-03-08 ENCOUNTER — Other Ambulatory Visit: Payer: Self-pay | Admitting: Family Medicine

## 2020-03-08 DIAGNOSIS — Z1231 Encounter for screening mammogram for malignant neoplasm of breast: Secondary | ICD-10-CM

## 2020-04-14 ENCOUNTER — Ambulatory Visit
Admission: RE | Admit: 2020-04-14 | Discharge: 2020-04-14 | Disposition: A | Discharge status: Home / Self Care | Payer: BC Managed Care – PPO | Source: Ambulatory Visit | Attending: Family Medicine | Admitting: Family Medicine

## 2020-04-14 ENCOUNTER — Other Ambulatory Visit: Payer: Self-pay

## 2020-04-14 DIAGNOSIS — Z1231 Encounter for screening mammogram for malignant neoplasm of breast: Secondary | ICD-10-CM | POA: Diagnosis not present

## 2020-08-24 ENCOUNTER — Ambulatory Visit (INDEPENDENT_AMBULATORY_CARE_PROVIDER_SITE_OTHER): Payer: BC Managed Care – PPO | Admitting: Family Medicine

## 2020-08-24 ENCOUNTER — Encounter: Payer: Self-pay | Admitting: Family Medicine

## 2020-08-24 ENCOUNTER — Other Ambulatory Visit: Payer: Self-pay

## 2020-08-24 VITALS — BP 136/82 | HR 92 | Ht 59.0 in | Wt 170.0 lb

## 2020-08-24 DIAGNOSIS — M65321 Trigger finger, right index finger: Secondary | ICD-10-CM

## 2020-08-24 DIAGNOSIS — R140 Abdominal distension (gaseous): Secondary | ICD-10-CM | POA: Diagnosis not present

## 2020-08-24 DIAGNOSIS — Z23 Encounter for immunization: Secondary | ICD-10-CM

## 2020-08-24 DIAGNOSIS — Z13220 Encounter for screening for lipoid disorders: Secondary | ICD-10-CM | POA: Diagnosis not present

## 2020-08-24 DIAGNOSIS — Z0000 Encounter for general adult medical examination without abnormal findings: Secondary | ICD-10-CM

## 2020-08-24 DIAGNOSIS — Z131 Encounter for screening for diabetes mellitus: Secondary | ICD-10-CM | POA: Diagnosis not present

## 2020-08-24 LAB — POCT GLYCOSYLATED HEMOGLOBIN (HGB A1C): Hemoglobin A1C: 5.7 % — AB (ref 4.0–5.6)

## 2020-08-24 MED ORDER — SHINGRIX 50 MCG/0.5ML IM SUSR: 0.5000 mL | Freq: Once | INTRAMUSCULAR | 1 refills | Status: AC

## 2020-08-24 NOTE — Addendum Note (Signed)
Addended by: Pamelia Hoit on: 08/24/2020 12:35 PM   Modules accepted: Orders

## 2020-08-24 NOTE — Progress Notes (Signed)
  Date of Visit: 08/24/2020   SUBJECTIVE:   HPI:  Arlie presents today for a well woman exam.   Concerns today: see below Periods: no period in last year Contraception: menopause Pelvic symptoms: no problems Sexual activity: with husband STD Screening: declines screening today Pap smear status: current, sees GYN Exercise: walks 3 times a week Smoking: no Alcohol: occasional Drugs: no Mood: no concerns Dentist: goes twice a year Cancers in family: Brother lung cancer, he was a smoker  Abdominal fullness - has noticed fullness/slight discomfort in her LUQ. Feels like possibly gas discomfort. Bowels move daily, no blood in stool. Last colonoscopy June 2019 was normal. Mother with history of colon cancer. Has not tried medications for it.  Trigger finger - index finger of R hand has trigger finger. Not interested in injections at this time.  L wrist pain - continued occasional discmofort in L wrist near radial styloid, previously advised to use thumb spica splint which she has worn on occasion but is limited in what she can wear due to her job. Not bothering her enough to want an injection at this time.  OBJECTIVE:   BP 136/82   Pulse 92   Wt 170 lb (77.1 kg)   SpO2 99%   BMI 34.34 kg/m  Gen: NAD, pleasant, cooperative HEENT: NCAT, PERRL, no palpable thyromegaly or anterior cervical lymphadenopathy Heart: RRR, no murmurs Lungs: CTAB, NWOB Abdomen: soft, nontender to palpation, no masses or organomegaly. No bloating appreciated. Normoactive bowel sounds  Extremities: No appreciable lower extremity edema bilaterally  Neuro: grossly nonfocal, speech normal  ASSESSMENT/PLAN:   Health maintenance:  -STD screening: declines today -pap smear: current -mammogram: current -lipid screening: check lipids today -colon cancer screening: current -immunizations:  . Flu: declines today . Tdap: given today . Shingrix: advised, given rx for her to get it at her pharmacy . COVID:  UTD, has received booster -handout given on health maintenance topics  DeQuervains/trigger finger Advised she could get injections if these worsen or bother her more.   Abdominal discomfort/bloating Benign abdominal exam, no changes in bowel habits. Reviewed options, can do trial of over the counter anti-gas medication, also could try miralax in case stool burden is contributing. Reviewed red flag symptoms with patient (unintentional weight loss, blood in stool, worsening pain, decreased appetite etc) so she is aware to call us if any of these develop or if symptoms do not improve.  FOLLOW UP: Follow up in 1 year for next CPE, sooner if needed  [place] J. Pollie Meyer, MD [facility]

## 2020-08-24 NOTE — Patient Instructions (Addendum)
It was great to see you again today!  Call us if pain in belly does not improve, or if it worsens. Also call for blood in stool, weight loss, decreased appetite, etc. Can try over the counter gas medication and see if this helps.  Checking labs today  Be well, Dr. Pollie Meyer    Health Maintenance for Postmenopausal Women Menopause is a normal process in which your ability to get pregnant comes to an end. This process happens slowly over many months or years, usually between the ages of 36 and 34. Menopause is complete when you have missed your menstrual periods for 12 months. It is important to talk with your health care provider about some of the most common conditions that affect women after menopause (postmenopausal women). These include heart disease, cancer, and bone loss (osteoporosis). Adopting a healthy lifestyle and getting preventive care can help to promote your health and wellness. The actions you take can also lower your chances of developing some of these common conditions. What should I know about menopause? During menopause, you may get a number of symptoms, such as:  Hot flashes. These can be moderate or severe.  Night sweats.  Decrease in sex drive.  Mood swings.  Headaches.  Tiredness.  Irritability.  Memory problems.  Insomnia. Choosing to treat or not to treat these symptoms is a decision that you make with your health care provider. Do I need hormone replacement therapy?  Hormone replacement therapy is effective in treating symptoms that are caused by menopause, such as hot flashes and night sweats.  Hormone replacement carries certain risks, especially as you become older. If you are thinking about using estrogen or estrogen with progestin, discuss the benefits and risks with your health care provider. What is my risk for heart disease and stroke? The risk of heart disease, heart attack, and stroke increases as you age. One of the causes may be a change in  the body's hormones during menopause. This can affect how your body uses dietary fats, triglycerides, and cholesterol. Heart attack and stroke are medical emergencies. There are many things that you can do to help prevent heart disease and stroke. Watch your blood pressure  High blood pressure causes heart disease and increases the risk of stroke. This is more likely to develop in people who have high blood pressure readings, are of African descent, or are overweight.  Have your blood pressure checked: ? Every 3-5 years if you are 77-10 years of age. ? Every year if you are 25 years old or older. Eat a healthy diet   Eat a diet that includes plenty of vegetables, fruits, low-fat dairy products, and lean protein.  Do not eat a lot of foods that are high in solid fats, added sugars, or sodium. Get regular exercise Get regular exercise. This is one of the most important things you can do for your health. Most adults should:  Try to exercise for at least 150 minutes each week. The exercise should increase your heart rate and make you sweat (moderate-intensity exercise).  Try to do strengthening exercises at least twice each week. Do these in addition to the moderate-intensity exercise.  Spend less time sitting. Even light physical activity can be beneficial. Other tips  Work with your health care provider to achieve or maintain a healthy weight.  Do not use any products that contain nicotine or tobacco, such as cigarettes, e-cigarettes, and chewing tobacco. If you need help quitting, ask your health care provider.  Know  your numbers. Ask your health care provider to check your cholesterol and your blood sugar (glucose). Continue to have your blood tested as directed by your health care provider. Do I need screening for cancer? Depending on your health history and family history, you may need to have cancer screening at different stages of your life. This may include screening for:  Breast  cancer.  Cervical cancer.  Lung cancer.  Colorectal cancer. What is my risk for osteoporosis? After menopause, you may be at increased risk for osteoporosis. Osteoporosis is a condition in which bone destruction happens more quickly than new bone creation. To help prevent osteoporosis or the bone fractures that can happen because of osteoporosis, you may take the following actions:  If you are 85-59 years old, get at least 1,000 mg of calcium and at least 600 mg of vitamin D per day.  If you are older than age 87 but younger than age 5, get at least 1,200 mg of calcium and at least 600 mg of vitamin D per day.  If you are older than age 81, get at least 1,200 mg of calcium and at least 800 mg of vitamin D per day. Smoking and drinking excessive alcohol increase the risk of osteoporosis. Eat foods that are rich in calcium and vitamin D, and do weight-bearing exercises several times each week as directed by your health care provider. How does menopause affect my mental health? Depression may occur at any age, but it is more common as you become older. Common symptoms of depression include:  Low or sad mood.  Changes in sleep patterns.  Changes in appetite or eating patterns.  Feeling an overall lack of motivation or enjoyment of activities that you previously enjoyed.  Frequent crying spells. Talk with your health care provider if you think that you are experiencing depression. General instructions See your health care provider for regular wellness exams and vaccines. This may include:  Scheduling regular health, dental, and eye exams.  Getting and maintaining your vaccines. These include: ? Influenza vaccine. Get this vaccine each year before the flu season begins. ? Pneumonia vaccine. ? Shingles vaccine. ? Tetanus, diphtheria, and pertussis (Tdap) booster vaccine. Your health care provider may also recommend other immunizations. Tell your health care provider if you have ever  been abused or do not feel safe at home. Summary  Menopause is a normal process in which your ability to get pregnant comes to an end.  This condition causes hot flashes, night sweats, decreased interest in sex, mood swings, headaches, or lack of sleep.  Treatment for this condition may include hormone replacement therapy.  Take actions to keep yourself healthy, including exercising regularly, eating a healthy diet, watching your weight, and checking your blood pressure and blood sugar levels.  Get screened for cancer and depression. Make sure that you are up to date with all your vaccines. This information is not intended to replace advice given to you by your health care provider. Make sure you discuss any questions you have with your health care provider. Document Revised: 08/05/2018 Document Reviewed: 08/05/2018 Elsevier Patient Education  2020 [employer].

## 2020-08-25 LAB — LIPID PANEL
Chol/HDL Ratio: 3 ratio (ref 0.0–4.4)
Cholesterol, Total: 218 mg/dL — ABNORMAL HIGH (ref 100–199)
HDL: 72 mg/dL (ref >39)
LDL Chol Calc (NIH): 134 mg/dL — ABNORMAL HIGH (ref 0–99)
Triglycerides: 69 mg/dL (ref 0–149)
VLDL Cholesterol Cal: 12 mg/dL (ref 5–40)

## 2020-08-25 LAB — CMP14+EGFR
ALT: 23 IU/L (ref 0–32)
AST: 21 IU/L (ref 0–40)
Albumin/Globulin Ratio: 1.3 (ref 1.2–2.2)
Albumin: 4.3 g/dL (ref 3.8–4.9)
Alkaline Phosphatase: 90 IU/L (ref 44–121)
BUN/Creatinine Ratio: 23 (ref 9–23)
BUN: 15 mg/dL (ref 6–24)
Bilirubin Total: 0.3 mg/dL (ref 0.0–1.2)
CO2: 25 mmol/L (ref 20–29)
Calcium: 9.8 mg/dL (ref 8.7–10.2)
Chloride: 102 mmol/L (ref 96–106)
Creatinine, Ser: 0.66 mg/dL (ref 0.57–1.00)
GFR calc Af Amer: 114 mL/min/{1.73_m2} (ref >59)
GFR calc non Af Amer: 99 mL/min/{1.73_m2} (ref >59)
Globulin, Total: 3.4 g/dL (ref 1.5–4.5)
Glucose: 103 mg/dL — ABNORMAL HIGH (ref 65–99)
Potassium: 4.3 mmol/L (ref 3.5–5.2)
Sodium: 139 mmol/L (ref 134–144)
Total Protein: 7.7 g/dL (ref 6.0–8.5)

## 2020-09-05 NOTE — Progress Notes (Unsigned)
55 y.o. G101P1001 Married [place] American female here for annual exam.    Patient request pap today. She knows she may need to pay for cervical cancer screening.   Having hot flashes.  Tolerating them well.   Took her Covid booster.  No flu vaccine.   PCP:   Chrisandra Netters, MD   Patient's last menstrual period was 03/27/2019 (exact date).           Sexually active: Yes.    The current method of family planning is condoms everytime.    Exercising: No.  The patient does not participate in regular exercise at present. Smoker:  no  Health Maintenance: Pap:  08-04-2019 negative, HR HPV negative            07-29-18 negative, HR HPV negative             07/02/17 negative, HR HPV negative History of abnormal Pap:  Yes.  Hx AGUS pap 2015, neg HR HPV, colpo ECC and exocervical bx negative, EMB negative. MMG:  04-14-20 density C/BIRADS 1 negative  Colonoscopy:  02-12-18 polyps, next due 01-2023 BMD:   n/a  Result  n/a TDaP:  08-24-20 Gardasil:   no HIV: 02-19-16 negative  Hep C: 02-19-16 negative  Screening Labs:  PCP.    reports that she has never smoked. She has never used smokeless tobacco. She reports that she does not drink alcohol and does not use drugs.  Past Medical History:  Diagnosis Date  . Abnormal Pap smear of cervix 01-31-14   --AGUS pap with neg HR HPV with [facility]  . Fibroid   . Personal history of adenomatous colonic polyp 08/18/2012   07/2012 - 5 mm adenoma    Past Surgical History:  Procedure Laterality Date  . COLONOSCOPY  07/2012, 01/2018    Current Outpatient Medications  Medication Sig Dispense Refill  . B Complex-C (SUPER B COMPLEX PO) Take by mouth.    . Cholecalciferol (VITAMIN D3 PO) Take by mouth.    Water engineer Bandages & Supports (WRIST SPLINT/COCK-UP/LEFT M) MISC Wear daily 1 each 0  . fluticasone (FLONASE) 50 MCG/ACT nasal spray Place 1 spray into both nostrils daily. 1 spray in each nostril every day (Patient taking differently: Place 1 spray  into both nostrils daily as needed. 1 spray in each nostril every day) 16 g 12  . multivitamin-iron-minerals-folic acid (CENTRUM) chewable tablet Chew 1 tablet by mouth daily.    . vitamin C (ASCORBIC ACID) 500 MG tablet Take 500 mg by mouth daily.    Marland Kitchen VITAMIN E PO Take by mouth.    . zinc gluconate 50 MG tablet Take 50 mg by mouth daily.     No current facility-administered medications for this visit.    Family History  Problem Relation Age of Onset  . Diabetes Mother   . Hypertension Mother   . Cancer Mother        Colon, diagnosed late 8s  . Colon cancer Mother 30  . Cancer Father   . Stomach cancer Father 21  . Diabetes Sister   . Hypertension Sister   . Seizures Brother   . Diabetes Brother   . Diabetes Brother   . Cancer Brother        Dec lung CA at 94, was a smoker  . Esophageal cancer Neg Hx   . Rectal cancer Neg Hx     Review of Systems  All other systems reviewed and are negative.   Exam:  BP 130/82 (Cuff Size: Large)   Pulse 97   Ht 4\' 11"  (1.499 m)   Wt 170 lb (77.1 kg)   LMP 03/27/2019 (Exact Date)   SpO2 100%   BMI 34.34 kg/m     General appearance: alert, cooperative and appears stated age Head: normocephalic, without obvious abnormality, atraumatic Neck: no adenopathy, supple, symmetrical, trachea midline and thyroid normal to inspection and palpation Lungs: clear to auscultation bilaterally Breasts: normal appearance, no masses or tenderness, No nipple retraction or dimpling, No nipple discharge or bleeding, No axillary adenopathy Heart: regular rate and rhythm Abdomen: soft, non-tender; no masses, no organomegaly Extremities: extremities normal, atraumatic, no cyanosis or edema Skin: skin color, texture, turgor normal. No rashes or lesions Lymph nodes: cervical, supraclavicular, and axillary nodes normal. Neurologic: grossly normal  Pelvic: External genitalia:  no lesions              No abnormal inguinal nodes palpated.               Urethra:  normal appearing urethra with no masses, tenderness or lesions              Bartholins and Skenes: normal                 Vagina: normal appearing vagina with normal color and discharge, no lesions              Cervix: no lesions              Pap taken: Yes.   Bimanual Exam:  Uterus:  normal size, contour, position, consistency, mobility, non-tender              Adnexa: no mass, fullness, tenderness              Rectal exam: Yes.  .  Confirms.              Anus:  normal sphincter tone, no lesions  Chaperone was present for exam.  Assessment:   Well woman visit with normal exam. Cervical cancer screening.  Hx prior AGUS pap and negative work up.  Fibroid uterus.  FH of colon, stomach, and lung cancer. Elevated A1C 5.7 on chart review.  Plan: Mammogram screening discussed. Self breast awareness reviewed. Pap and HR HPV as above. Guidelines for Calcium, Vitamin D, regular exercise program including cardiovascular and weight bearing exercise.   Follow up annually and prn.

## 2020-09-06 ENCOUNTER — Other Ambulatory Visit: Payer: Self-pay

## 2020-09-06 ENCOUNTER — Encounter: Payer: Self-pay | Admitting: Obstetrics and Gynecology

## 2020-09-06 ENCOUNTER — Other Ambulatory Visit (HOSPITAL_COMMUNITY)
Admission: RE | Admit: 2020-09-06 | Discharge: 2020-09-06 | Disposition: A | Discharge status: Home / Self Care | Payer: BC Managed Care – PPO | Source: Ambulatory Visit | Attending: Obstetrics and Gynecology | Admitting: Obstetrics and Gynecology

## 2020-09-06 ENCOUNTER — Ambulatory Visit (INDEPENDENT_AMBULATORY_CARE_PROVIDER_SITE_OTHER): Payer: BC Managed Care – PPO | Admitting: Obstetrics and Gynecology

## 2020-09-06 VITALS — BP 130/82 | HR 97 | Ht 59.0 in | Wt 170.0 lb

## 2020-09-06 DIAGNOSIS — Z01419 Encounter for gynecological examination (general) (routine) without abnormal findings: Secondary | ICD-10-CM

## 2020-09-06 DIAGNOSIS — Z124 Encounter for screening for malignant neoplasm of cervix: Secondary | ICD-10-CM | POA: Diagnosis not present

## 2020-09-06 NOTE — Patient Instructions (Signed)

## 2020-09-07 LAB — CYTOLOGY - PAP
Comment: NEGATIVE
Diagnosis: NEGATIVE
High risk HPV: NEGATIVE

## 2021-03-16 ENCOUNTER — Other Ambulatory Visit: Payer: Self-pay | Admitting: Family Medicine

## 2021-03-16 DIAGNOSIS — Z1231 Encounter for screening mammogram for malignant neoplasm of breast: Secondary | ICD-10-CM

## 2021-04-16 ENCOUNTER — Ambulatory Visit
Admission: RE | Admit: 2021-04-16 | Discharge: 2021-04-16 | Disposition: A | Discharge status: Home / Self Care | Payer: BC Managed Care – PPO | Source: Ambulatory Visit | Attending: Family Medicine | Admitting: Family Medicine

## 2021-04-16 ENCOUNTER — Other Ambulatory Visit: Payer: Self-pay

## 2021-04-16 DIAGNOSIS — Z1231 Encounter for screening mammogram for malignant neoplasm of breast: Secondary | ICD-10-CM | POA: Diagnosis not present

## 2021-08-28 ENCOUNTER — Other Ambulatory Visit: Payer: Self-pay

## 2021-08-28 ENCOUNTER — Ambulatory Visit (INDEPENDENT_AMBULATORY_CARE_PROVIDER_SITE_OTHER): Payer: BC Managed Care – PPO | Admitting: Family Medicine

## 2021-08-28 ENCOUNTER — Encounter: Payer: Self-pay | Admitting: Family Medicine

## 2021-08-28 VITALS — BP 126/80 | HR 92 | Ht 59.0 in | Wt 171.0 lb

## 2021-08-28 DIAGNOSIS — Z01419 Encounter for gynecological examination (general) (routine) without abnormal findings: Secondary | ICD-10-CM | POA: Insufficient documentation

## 2021-08-28 DIAGNOSIS — R7303 Prediabetes: Secondary | ICD-10-CM | POA: Diagnosis not present

## 2021-08-28 DIAGNOSIS — Z23 Encounter for immunization: Secondary | ICD-10-CM | POA: Diagnosis not present

## 2021-08-28 DIAGNOSIS — S161XXA Strain of muscle, fascia and tendon at neck level, initial encounter: Secondary | ICD-10-CM

## 2021-08-28 LAB — POCT GLYCOSYLATED HEMOGLOBIN (HGB A1C): HbA1c, POC (prediabetic range): 5.8 % (ref 5.7–6.4)

## 2021-08-28 MED ORDER — DICLOFENAC SODIUM 1 % EX GEL: 2.0000 g | Freq: Four times a day (QID) | CUTANEOUS | 1 refills | Status: DC

## 2021-08-28 NOTE — Assessment & Plan Note (Signed)
-   A1c and lipid panel ordered, discussed increasing exercise

## 2021-08-28 NOTE — Assessment & Plan Note (Addendum)
-   no signs of radiculopathy, negative Spurlings, no C spine tenderness or history of trauma - diclofenac gel sent to pharmacy, PRN apap, offered PT but she would like to try gel and PRN apap at this time - no red flag signs or symptoms but discussed with her including weakness, numbness, headaches, radiculopathy symptoms

## 2021-08-28 NOTE — Assessment & Plan Note (Addendum)
-   up to date on screenings, flu vaccine given today - declined COVID and Shingles vaccines - requesting full lab work although asymptomatic, discussed insurance may not cover, will do CBC/CMP/TSH per request - discussed due for mammogram this August, and colonoscopy in 2024 - discussed with normal pap good for 5 years but she prefers to get yearly, offered today but states she will get with her OB GYN

## 2021-08-28 NOTE — Patient Instructions (Addendum)
It was wonderful to see you today.  Please bring ALL of your medications with you to every visit.   Today we talked about:  - We will check your blood work, some of it may not be covered by insurance since you have no symptoms - Continue to work on exercising more! - If you change your mind about Shingles vaccine you can get it at any pharmacy - If you change your mind about the McKenzie booster you can get at pharmacy or make a nurse only visit at our clinic   Thank you for choosing [facility].   Please call [phone] with any questions about today's appointment.  Please be sure to schedule follow up at the front  desk before you leave today.   Yehuda Savannah, MD  Family Medicine

## 2021-08-28 NOTE — Progress Notes (Signed)
° ° °  SUBJECTIVE:   CHIEF COMPLAINT / HPI:   Pt is a 56 y.o. yo female who presents for a well woman exam.  Concerns:  Neck strain- past two weeks, works as a Scientist, water quality at [employer] and always turning to right side, no headaches, no vision changes, no paresthesias, no numbness or weakness. Only sore on R side and R trapezius as well. Sometimes soreness does go down arm. Hasn't tried anything yet for it. Not had anything like this before. No traumatic injury.  Menstrual Hx: LMP 2020 Obstetrical Hx: G1P1001 - Pregnancy complications: none - Cesarian sections: none Contraception: Currently using condoms, but is postmenopausal. Sexual Hx: Sexually active, one partners her husband. - H/o STIs: none, declines testing Gynecologic Procedures/Surgeries: None Urinary incontinence symptoms: sometimes a little stress incontinence, but irregular and tried Kegel Diet: get fruits and vegetables daily Exercise: 30 minutes a few times a week IPV: Pt feels same at home. Social: never smoker, only occasional alcohol use, no illicit drug use.  Pap smear: Last one normal 09/06/20, NILM HPV neg - does have remote history of ASCUS and prefers to get yearly, offered here but she prefers with her OB GYN Colonoscopy: Last one 2019, results adenomas x2, due for repeat in 2024 Mammogram: Last one 03/2021, results normal, likes to get yearly DEXA scan: never had BRCA Risk: low, no family hx  Family Hx: - Breast cancer: none - Endometrial cancer: none - Cervical cancer: none - Ovarian Cancer: none - Colon Cancer: mother, in her 68s. Had a partial colectomy  Declines COVID booster, & Shingles vaccine.  PERTINENT  PMH / PSH: none  OBJECTIVE:   BP 126/80    Pulse 92    Ht _0  (1.499 m)    Wt 171 lb (77.6 kg)    LMP 03/27/2019 (Exact Date)    SpO2 100%    BMI 34.54 kg/m   General: A&O, NAD HEENT: No sign of trauma, EOM grossly intact, bilateral TM clear nonerythematous without fluid build up, OP clear, nares  clear, no thyromegaly, no cervical lymphadenopathy, + cervical paraspinal muscles on right next TTP, no C-spine tenderness, normal rotation and ROM of neck Cardiac: RRR, no m/r/g Respiratory: CTAB, normal WOB, no w/c/r GI: Soft, NTTP, non-distended  Extremities: NTTP, no peripheral edema. Neuro: Normal gait, moves all four extremities appropriately. Patellar and brachial DTRs 2+ bialterally, strength 5/5 in bilateral upper extremities without pain MSK: Spurlings test of neck negative bilatearlly Psych: Appropriate mood and affect   ASSESSMENT/PLAN:   Encounter for well woman exam - up to date on screenings, flu vaccine given today - declined COVID and Shingles vaccines - requesting full lab work although asymptomatic, discussed insurance may not cover, will do CBC/CMP/TSH per request - discussed due for mammogram this August, and colonoscopy in 2024 - discussed with normal pap good for 5 years but she prefers to get yearly, offered today but states she will get with her OB GYN  Prediabetes - A1c and lipid panel ordered, discussed increasing exercise  Neck strain - no signs of radiculopathy, negative Spurlings, no C spine tenderness or history of trauma - diclofenac gel sent to pharmacy, PRN apap, offered PT but she would like to try gel and PRN apap at this time - no red flag signs or symptoms but discussed with her including weakness, numbness, headaches     Lenoria Chime, MD [place]

## 2021-08-29 LAB — COMPREHENSIVE METABOLIC PANEL
ALT: 20 IU/L (ref 0–32)
AST: 22 IU/L (ref 0–40)
Albumin/Globulin Ratio: 1.3 (ref 1.2–2.2)
Albumin: 4.4 g/dL (ref 3.8–4.9)
Alkaline Phosphatase: 88 IU/L (ref 44–121)
BUN/Creatinine Ratio: 15 (ref 9–23)
BUN: 10 mg/dL (ref 6–24)
Bilirubin Total: 0.3 mg/dL (ref 0.0–1.2)
CO2: 26 mmol/L (ref 20–29)
Calcium: 9.4 mg/dL (ref 8.7–10.2)
Chloride: 103 mmol/L (ref 96–106)
Creatinine, Ser: 0.65 mg/dL (ref 0.57–1.00)
Globulin, Total: 3.3 g/dL (ref 1.5–4.5)
Glucose: 97 mg/dL (ref 70–99)
Potassium: 4.2 mmol/L (ref 3.5–5.2)
Sodium: 139 mmol/L (ref 134–144)
Total Protein: 7.7 g/dL (ref 6.0–8.5)
eGFR: 103 mL/min/{1.73_m2} (ref >59)

## 2021-08-29 LAB — LIPID PANEL
Chol/HDL Ratio: 3.3 ratio (ref 0.0–4.4)
Cholesterol, Total: 216 mg/dL — ABNORMAL HIGH (ref 100–199)
HDL: 66 mg/dL (ref >39)
LDL Chol Calc (NIH): 133 mg/dL — ABNORMAL HIGH (ref 0–99)
Triglycerides: 96 mg/dL (ref 0–149)
VLDL Cholesterol Cal: 17 mg/dL (ref 5–40)

## 2021-08-29 LAB — CBC
Hematocrit: 38.4 % (ref 34.0–46.6)
Hemoglobin: 13 g/dL (ref 11.1–15.9)
MCH: 29.6 pg (ref 26.6–33.0)
MCHC: 33.9 g/dL (ref 31.5–35.7)
MCV: 88 fL (ref 79–97)
Platelets: 262 10*3/uL (ref 150–450)
RBC: 4.39 x10E6/uL (ref 3.77–5.28)
RDW: 12.3 % (ref 11.7–15.4)
WBC: 4.7 10*3/uL (ref 3.4–10.8)

## 2021-08-29 LAB — TSH: TSH: 1.01 u[IU]/mL (ref 0.450–4.500)

## 2021-09-13 ENCOUNTER — Other Ambulatory Visit: Payer: Self-pay

## 2021-09-13 ENCOUNTER — Other Ambulatory Visit (HOSPITAL_COMMUNITY)
Admission: RE | Admit: 2021-09-13 | Discharge: 2021-09-13 | Disposition: A | Discharge status: Home / Self Care | Payer: BC Managed Care – PPO | Source: Ambulatory Visit | Attending: Obstetrics and Gynecology | Admitting: Obstetrics and Gynecology

## 2021-09-13 ENCOUNTER — Ambulatory Visit (INDEPENDENT_AMBULATORY_CARE_PROVIDER_SITE_OTHER): Payer: BC Managed Care – PPO | Admitting: Obstetrics and Gynecology

## 2021-09-13 ENCOUNTER — Encounter: Payer: Self-pay | Admitting: Obstetrics and Gynecology

## 2021-09-13 VITALS — BP 138/88 | HR 94 | Ht 58.25 in | Wt 168.0 lb

## 2021-09-13 DIAGNOSIS — Z01419 Encounter for gynecological examination (general) (routine) without abnormal findings: Secondary | ICD-10-CM | POA: Insufficient documentation

## 2021-09-13 DIAGNOSIS — Z124 Encounter for screening for malignant neoplasm of cervix: Secondary | ICD-10-CM | POA: Insufficient documentation

## 2021-09-13 NOTE — Progress Notes (Signed)
56 y.o. G53P1001 Married [place] American female here for annual exam.    Some increased heat, and it is getting better.  No vaginal dryness.   She wants a pap and HR HPV testing, and she understand insurance may not pay.   PCP:  Chrisandra Netters, MD   Patient's last menstrual period was 03/27/2019 (exact date).           Sexually active: Yes.    The current method of family planning is condoms most of the time.    Exercising: No.   Doing stretches Smoker:  no  Health Maintenance: Pap:  09-06-20 Neg:Neg HR HPV, 08-04-19 Neg:Neg HR HPV, 07-29-18 Neg:Neg HR HPV History of abnormal Pap:  yes,  Hx AGUS pap 2015, neg HR HPV, colpo ECC and exocervical bx negative, EMB negative. MMG:  04-16-21 Neg/BiRads1 Colonoscopy:    02-12-18 polyps, next due 01-2023 BMD:   n/a  Result  n/a TDaP:  08-24-20 Gardasil:   no HIV: 02-19-16 negative  Hep C:02-19-16 negative  Screening Labs:  PCP Flu vaccine:  completed.    reports that she has never smoked. She has never used smokeless tobacco. She reports that she does not drink alcohol and does not use drugs.  Past Medical History:  Diagnosis Date   Abnormal Pap smear of cervix 01-31-14   --AGUS pap with neg HR HPV with [facility]   Fibroid    Personal history of adenomatous colonic polyp 08/18/2012   07/2012 - 5 mm adenoma    Past Surgical History:  Procedure Laterality Date   COLONOSCOPY  07/2012, 01/2018    Current Outpatient Medications  Medication Sig Dispense Refill   B Complex-C (SUPER B COMPLEX PO) Take by mouth.     Cholecalciferol (VITAMIN D3 PO) Take by mouth.     diclofenac Sodium (VOLTAREN) 1 % GEL Apply 2 g topically 4 (four) times daily. 50 g 1   multivitamin-iron-minerals-folic acid (CENTRUM) chewable tablet Chew 1 tablet by mouth daily.     vitamin C (ASCORBIC ACID) 500 MG tablet Take 500 mg by mouth daily.     VITAMIN E PO Take by mouth.     zinc gluconate 50 MG tablet Take 50 mg by mouth daily.     No current  facility-administered medications for this visit.    Family History  Problem Relation Age of Onset   Diabetes Mother    Hypertension Mother    Cancer Mother        Colon, diagnosed late 35s   Colon cancer Mother 27   Cancer Father    Stomach cancer Father 64   Diabetes Sister    Hypertension Sister    Seizures Brother    Diabetes Brother    Diabetes Brother    Cancer Brother        Dec lung CA at 27, was a smoker   Esophageal cancer Neg Hx    Rectal cancer Neg Hx     Review of Systems  All other systems reviewed and are negative.  Exam:   BP 138/88    Pulse 94    Ht 4' 10.25" (1.48 m)    Wt 168 lb (76.2 kg)    LMP 03/27/2019 (Exact Date)    SpO2 99%    BMI 34.81 kg/m     General appearance: alert, cooperative and appears stated age Head: normocephalic, without obvious abnormality, atraumatic Neck: no adenopathy, supple, symmetrical, trachea midline and thyroid normal to inspection and palpation Lungs: clear to  auscultation bilaterally Breasts: normal appearance, no masses or tenderness, No nipple retraction or dimpling, No nipple discharge or bleeding, No axillary adenopathy Heart: regular rate and rhythm Abdomen: soft, non-tender; no masses, no organomegaly Extremities: extremities normal, atraumatic, no cyanosis or edema Skin: skin color, texture, turgor normal. No rashes or lesions Lymph nodes: cervical, supraclavicular, and axillary nodes normal. Neurologic: grossly normal  Pelvic: External genitalia:  no lesions              No abnormal inguinal nodes palpated.              Urethra:  normal appearing urethra with no masses, tenderness or lesions              Bartholins and Skenes: normal                 Vagina: normal appearing vagina with normal color and discharge, no lesions              Cervix: no lesions              Pap taken: yes Bimanual Exam:  Uterus:  normal size, contour, position, consistency, mobility, non-tender              Adnexa: no mass,  fullness, tenderness              Rectal exam: yes.  Confirms.              Anus:  normal sphincter tone, no lesions  Chaperone was present for exam:  Estill Bamberg, CMA  Assessment:   Well woman visit with gynecologic exam. Hx prior AGUS pap and negative work up.  Fibroid uterus.  FH of colon, stomach, and lung cancer.  Plan: Mammogram screening discussed. Self breast awareness reviewed. Pap and HR HPV as above. Guidelines for Calcium, Vitamin D, regular exercise program including cardiovascular and weight bearing exercise.   Follow up annually and prn.   After visit summary provided.

## 2021-09-13 NOTE — Patient Instructions (Signed)

## 2021-09-17 LAB — CYTOLOGY - PAP
Comment: NEGATIVE
Diagnosis: NEGATIVE
High risk HPV: NEGATIVE

## 2021-11-07 IMAGING — MG MM DIGITAL SCREENING BILAT W/ TOMO AND CAD
8 series · 8 of 24 positions shown · non-contrast
Comparison: Previous exam(s).

CLINICAL DATA: Screening.

EXAM:
DIGITAL SCREENING BILATERAL MAMMOGRAM WITH TOMOSYNTHESIS AND CAD
TECHNIQUE: Bilateral screening digital craniocaudal and mediolateral oblique
mammograms were obtained. Bilateral screening digital breast
tomosynthesis was performed. The images were evaluated with
computer-aided detection.

[L CC synth-2D]
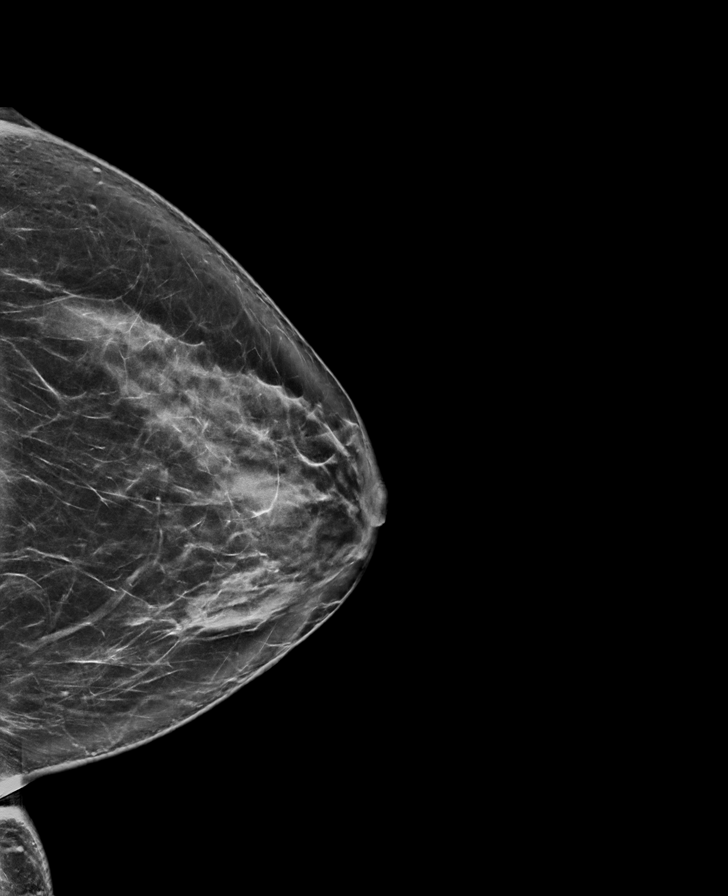

[R MLO synth-2D]
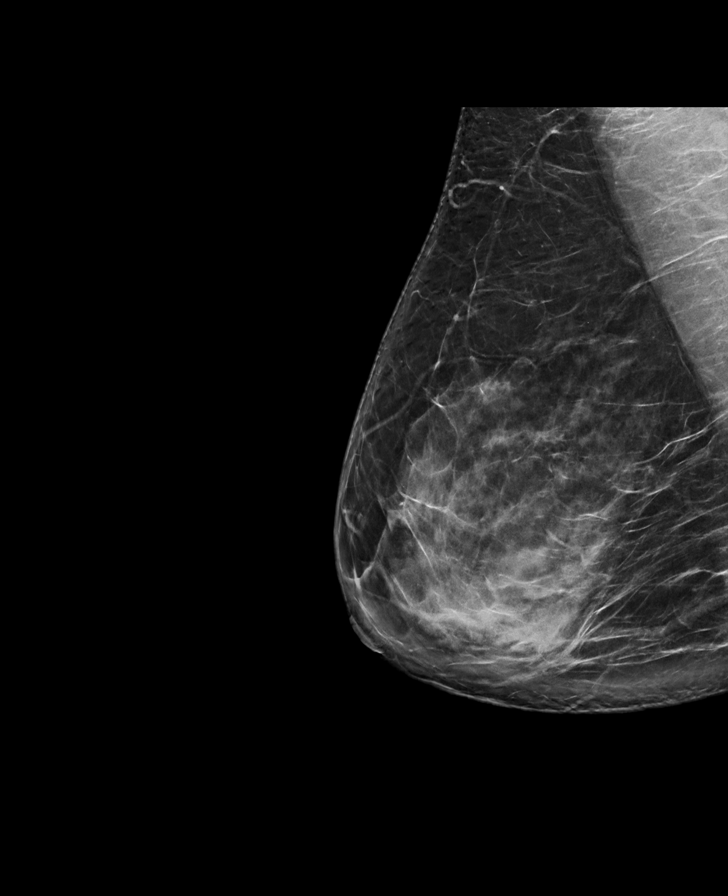

[L MLO synth-2D]
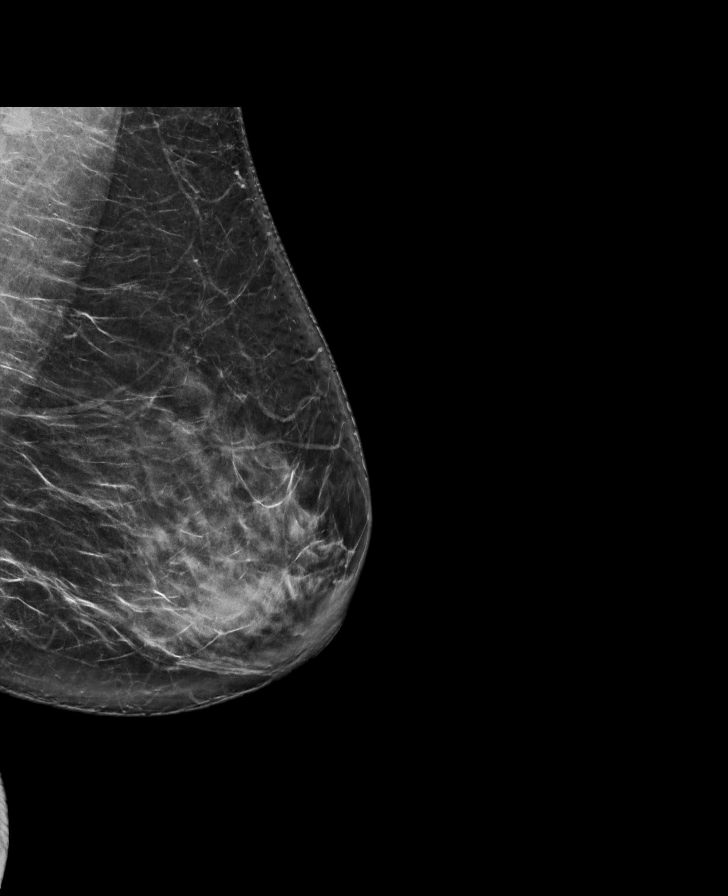

[R CC synth-2D]
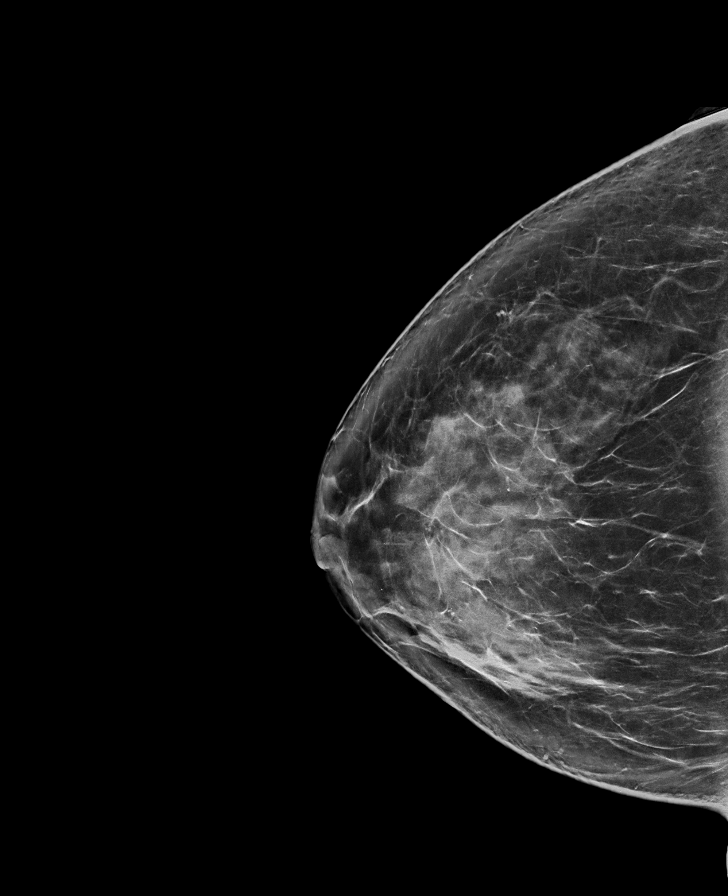

[L MLO tomo · tomo slice 41/82.0]
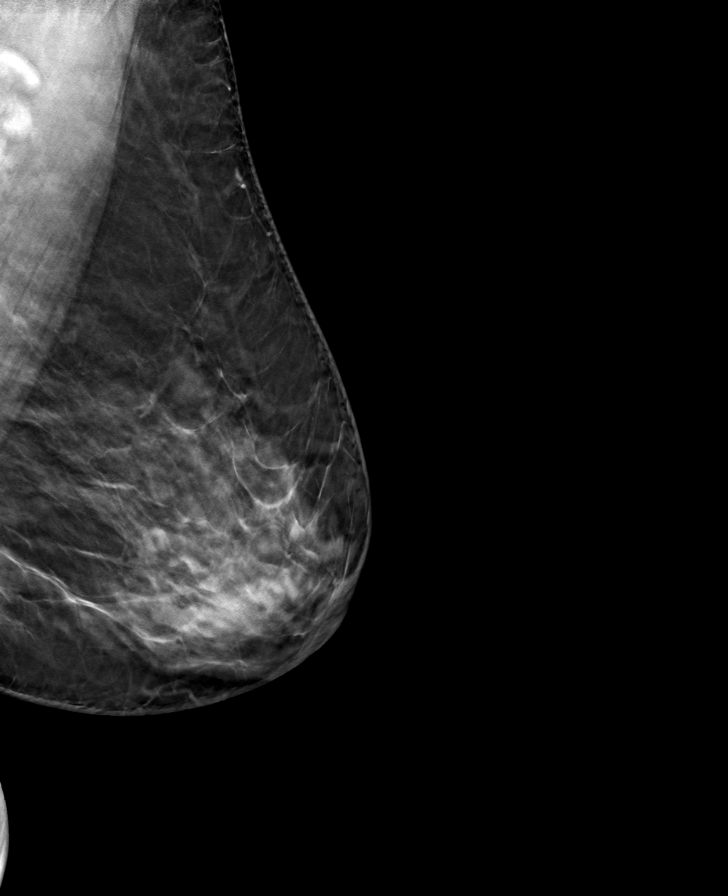

[R CC tomo · tomo slice 45/88.0]
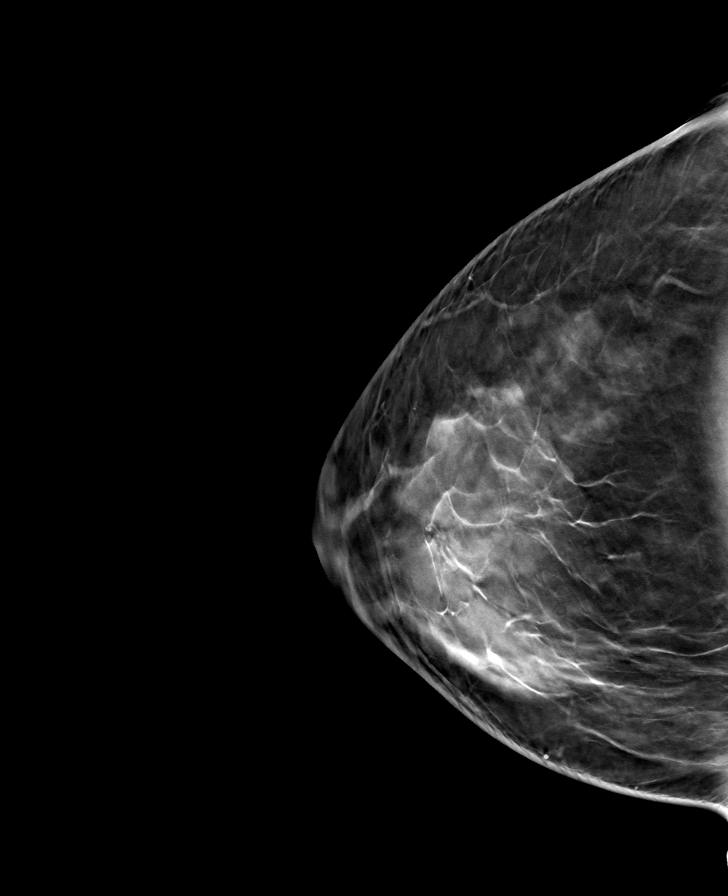

[L CC tomo · tomo slice 43/84.0]
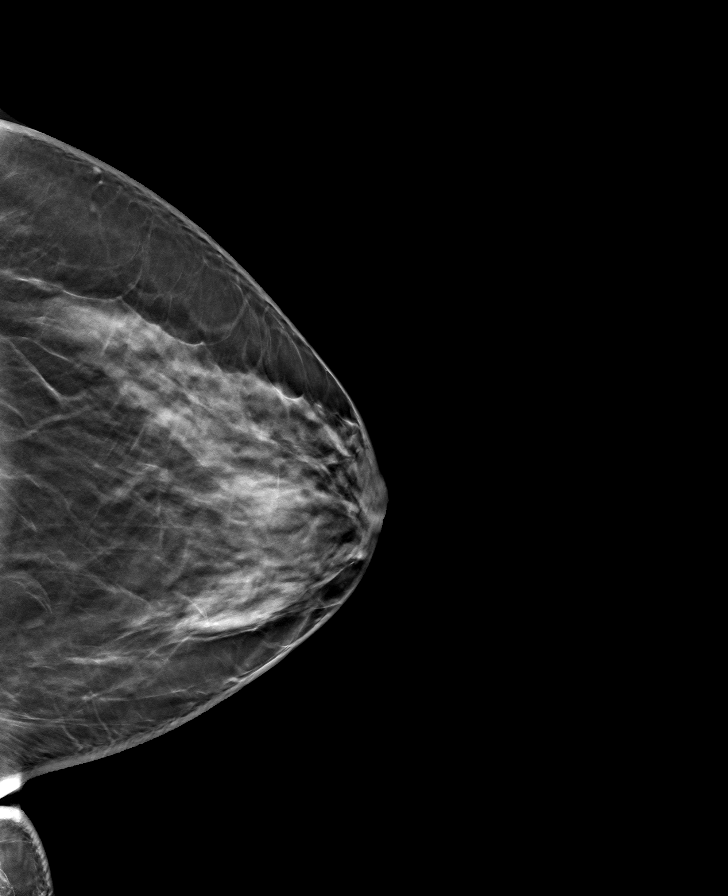

[R MLO tomo · tomo slice 46/91.0]
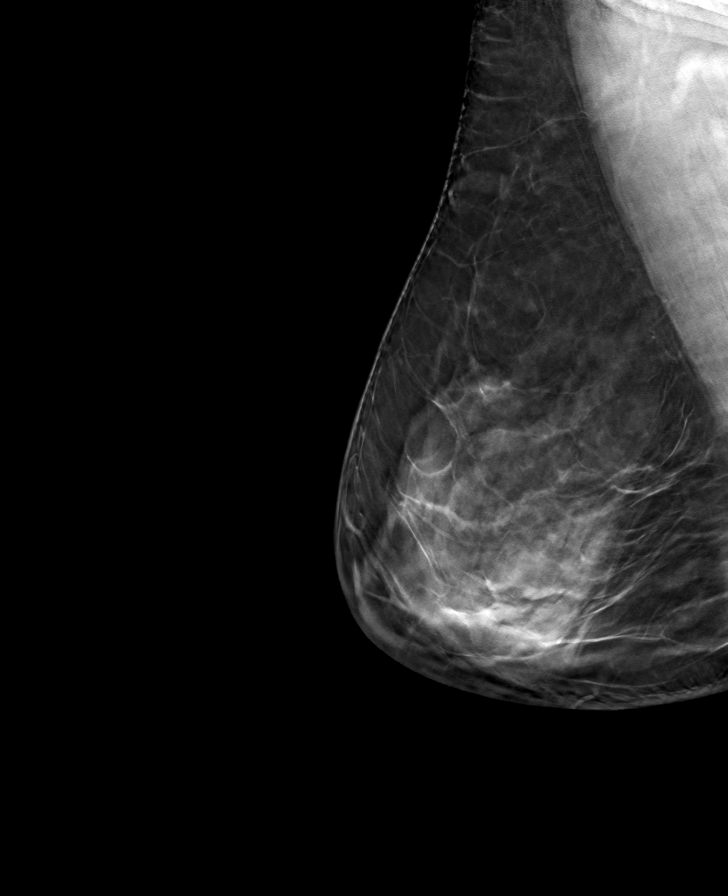

[8 of 24 positions shown; findings below may reference images not displayed]

ACR Breast Density Category c: The breast tissue is heterogeneously
dense, which may obscure small masses.
FINDINGS: There are no findings suspicious for malignancy.
IMPRESSION: No mammographic evidence of malignancy. A result letter of this
screening mammogram will be mailed directly to the patient.

RECOMMENDATION:
Screening mammogram in one year. (Code:Q3-W-BC3)

BI-RADS CATEGORY  1: Negative.

## 2022-01-29 ENCOUNTER — Encounter: Payer: Self-pay | Admitting: *Deleted

## 2022-03-18 ENCOUNTER — Other Ambulatory Visit: Payer: Self-pay | Admitting: Family Medicine

## 2022-03-18 DIAGNOSIS — Z1231 Encounter for screening mammogram for malignant neoplasm of breast: Secondary | ICD-10-CM

## 2022-04-19 ENCOUNTER — Ambulatory Visit
Admission: RE | Admit: 2022-04-19 | Discharge: 2022-04-19 | Disposition: A | Discharge status: Home / Self Care | Payer: BC Managed Care – PPO | Source: Ambulatory Visit | Attending: Family Medicine | Admitting: Family Medicine

## 2022-04-19 DIAGNOSIS — Z1231 Encounter for screening mammogram for malignant neoplasm of breast: Secondary | ICD-10-CM

## 2022-08-29 ENCOUNTER — Encounter: Payer: Self-pay | Admitting: Family Medicine

## 2022-08-29 ENCOUNTER — Ambulatory Visit (INDEPENDENT_AMBULATORY_CARE_PROVIDER_SITE_OTHER): Payer: BC Managed Care – PPO | Admitting: Family Medicine

## 2022-08-29 VITALS — BP 124/76 | HR 83 | Ht 59.0 in | Wt 162.0 lb

## 2022-08-29 DIAGNOSIS — R7303 Prediabetes: Secondary | ICD-10-CM

## 2022-08-29 DIAGNOSIS — Z01419 Encounter for gynecological examination (general) (routine) without abnormal findings: Secondary | ICD-10-CM

## 2022-08-29 DIAGNOSIS — S161XXA Strain of muscle, fascia and tendon at neck level, initial encounter: Secondary | ICD-10-CM

## 2022-08-29 DIAGNOSIS — Z23 Encounter for immunization: Secondary | ICD-10-CM | POA: Diagnosis not present

## 2022-08-29 LAB — POCT GLYCOSYLATED HEMOGLOBIN (HGB A1C): Hemoglobin A1C: 5.5 % (ref 4.0–5.6)

## 2022-08-29 MED ORDER — SHINGRIX 50 MCG/0.5ML IM SUSR: INTRAMUSCULAR | 1 refills | Status: DC

## 2022-08-29 MED ORDER — DICLOFENAC SODIUM 1 % EX GEL: 2.0000 g | Freq: Four times a day (QID) | CUTANEOUS | 1 refills | Status: DC

## 2022-08-29 NOTE — Patient Instructions (Signed)
It was great to see you again today!  Take shingles vaccine prescription to your pharmacy   Call GI office to schedule next colonoscopy  Keep up great work with exercise!  Try flonase for your sinuses  Be well, Dr. Ardelia Mems  Health Maintenance, Female Adopting a healthy lifestyle and getting preventive care are important in promoting health and wellness. Ask your health care provider about: The right schedule for you to have regular tests and exams. Things you can do on your own to prevent diseases and keep yourself healthy. What should I know about diet, weight, and exercise? Eat a healthy diet  Eat a diet that includes plenty of vegetables, fruits, low-fat dairy products, and lean protein. Do not eat a lot of foods that are high in solid fats, added sugars, or sodium. Maintain a healthy weight Body mass index (BMI) is used to identify weight problems. It estimates body fat based on height and weight. Your health care provider can help determine your BMI and help you achieve or maintain a healthy weight. Get regular exercise Get regular exercise. This is one of the most important things you can do for your health. Most adults should: Exercise for at least 150 minutes each week. The exercise should increase your heart rate and make you sweat (moderate-intensity exercise). Do strengthening exercises at least twice a week. This is in addition to the moderate-intensity exercise. Spend less time sitting. Even light physical activity can be beneficial. Watch cholesterol and blood lipids Have your blood tested for lipids and cholesterol at 59 years of age, then have this test every 5 years. Have your cholesterol levels checked more often if: Your lipid or cholesterol levels are high. You are older than 59 years of age. You are at high risk for heart disease. What should I know about cancer screening? Depending on your health history and family history, you may need to have cancer  screening at various ages. This may include screening for: Breast cancer. Cervical cancer. Colorectal cancer. Skin cancer. Lung cancer. What should I know about heart disease, diabetes, and high blood pressure? Blood pressure and heart disease High blood pressure causes heart disease and increases the risk of stroke. This is more likely to develop in people who have high blood pressure readings or are overweight. Have your blood pressure checked: Every 3-5 years if you are 27-90 years of age. Every year if you are 81 years old or older. Diabetes Have regular diabetes screenings. This checks your fasting blood sugar level. Have the screening done: Once every three years after age 49 if you are at a normal weight and have a low risk for diabetes. More often and at a younger age if you are overweight or have a high risk for diabetes. What should I know about preventing infection? Hepatitis B If you have a higher risk for hepatitis B, you should be screened for this virus. Talk with your health care provider to find out if you are at risk for hepatitis B infection. Hepatitis C Testing is recommended for: Everyone born from [birth year] through 1967. Anyone with known risk factors for hepatitis C. Sexually transmitted infections (STIs) Get screened for STIs, including gonorrhea and chlamydia, if: You are sexually active and are younger than 59 years of age. You are older than 59 years of age and your health care provider tells you that you are at risk for this type of infection. Your sexual activity has changed since you were last screened, and you are  at increased risk for chlamydia or gonorrhea. Ask your health care provider if you are at risk. Ask your health care provider about whether you are at high risk for HIV. Your health care provider may recommend a prescription medicine to help prevent HIV infection. If you choose to take medicine to prevent HIV, you should first get tested for HIV. You  should then be tested every 3 months for as long as you are taking the medicine. Pregnancy If you are about to stop having your period (premenopausal) and you may become pregnant, seek counseling before you get pregnant. Take 400 to 800 micrograms (mcg) of folic acid every day if you become pregnant. Ask for birth control (contraception) if you want to prevent pregnancy. Osteoporosis and menopause Osteoporosis is a disease in which the bones lose minerals and strength with aging. This can result in bone fractures. If you are 36 years old or older, or if you are at risk for osteoporosis and fractures, ask your health care provider if you should: Be screened for bone loss. Take a calcium or vitamin D supplement to lower your risk of fractures. Be given hormone replacement therapy (HRT) to treat symptoms of menopause. Follow these instructions at home: Alcohol use Do not drink alcohol if: Your health care provider tells you not to drink. You are pregnant, may be pregnant, or are planning to become pregnant. If you drink alcohol: Limit how much you have to: 0-1 drink a day. Know how much alcohol is in your drink. In the [place] one drink equals one 12 oz bottle of beer (355 mL), one 5 oz glass of wine (148 mL), or one 1 oz glass of hard liquor (44 mL). Lifestyle Do not use any products that contain nicotine or tobacco. These products include cigarettes, chewing tobacco, and vaping devices, such as e-cigarettes. If you need help quitting, ask your health care provider. Do not use street drugs. Do not share needles. Ask your health care provider for help if you need support or information about quitting drugs. General instructions Schedule regular health, dental, and eye exams. Stay current with your vaccines. Tell your health care provider if: You often feel depressed. You have ever been abused or do not feel safe at home. Summary Adopting a healthy lifestyle and getting preventive care are  important in promoting health and wellness. Follow your health care provider's instructions about healthy diet, exercising, and getting tested or screened for diseases. Follow your health care provider's instructions on monitoring your cholesterol and blood pressure. This information is not intended to replace advice given to you by your health care provider. Make sure you discuss any questions you have with your health care provider. Document Revised: 01/01/2021 Document Reviewed: 01/01/2021 Elsevier Patient Education  [place].

## 2022-08-29 NOTE — Progress Notes (Signed)
  Date of Visit: 08/29/2022   SUBJECTIVE:   HPI:  Samantha Key presents today for a well woman exam.   Concerns today: sinus drainage & L neck pain, see below Periods: postmenopausal Contraception: postmenopausal STD Screening: declines today Pap smear status: UTD Exercise: yes - has home workout machine she does Smoking: no Alcohol: no Drugs: no Advance directives: not interested Mood: no concerns  Neck spasm: Has spasm of left neck that goes occasionally down her left arm.  This happens 1-2 times per week.  She has not tried any medications for it.  It does not bother her that much.  Last about 5 minutes at a time.  Sinus pressure: Has intermittent pressure of left side of sinus.  Occasionally drips down her nose and throat.  Otherwise she is feeling well.  She did wake up with some of this today.  OBJECTIVE:   BP 124/76   Pulse 83   Ht '4\' 11"'$  (1.499 m)   Wt 162 lb (73.5 kg)   LMP 03/27/2019 (Exact Date)   SpO2 97%   BMI 32.72 kg/m  Gen: NAD, pleasant, cooperative HEENT: NCAT, no palpable thyromegaly or anterior cervical lymphadenopathy. Tympanic membranes clear bilaterally. Nares patent without drainage. Oropharynx clear. Heart: RRR, no murmurs Lungs: CTAB, NWOB Neuro: grossly nonfocal, speech normal Musculoskeletal : full ROM of neck. Negative spurlings bilaterally. Grip 5/5 bilaterally. Shoulder abduction & adduction 5/5 bilaterally. No tenderness of trap muscle on L.   ASSESSMENT/PLAN:   Health maintenance:  -STD screening: declines today -pap smear: UTD -mammogram: UTD -lipid screening: check lipids today -colon cancer screening: due for next colonoscopy June 2024 - advised to call GI office to schedule -immunizations:  Flu: given today Tdap: UTD Shingrix: rx given today COVID: declines booster today -handout given on health maintenance topics  Neck pain Intermittent 1-2 times a week. Normal exam today. May have some nerve irritation causing radiculopathy  down her arm on occasion. Discussed option of physical therapy, but doesn't bother her enough to pursue that. She will try voltaren gel on her neck/shoulder and see if this helps.  Sinus drainage Suspect seasonal allergies, no signs of infection today. Recommend flonase over the counter.   Prediabetes Update A1c, CMET, lipids, TSH, CBC today at patient's request  FOLLOW UP: Follow up in 1 year for next CPE  [name], [place]

## 2022-08-29 NOTE — Assessment & Plan Note (Signed)
Update A1c, CMET, lipids, TSH, CBC today at patient's request

## 2022-08-30 LAB — CMP14+EGFR
ALT: 16 IU/L (ref 0–32)
AST: 19 IU/L (ref 0–40)
Albumin/Globulin Ratio: 1.4 (ref 1.2–2.2)
Albumin: 4.4 g/dL (ref 3.8–4.9)
Alkaline Phosphatase: 85 IU/L (ref 44–121)
BUN/Creatinine Ratio: 14 (ref 9–23)
BUN: 11 mg/dL (ref 6–24)
Bilirubin Total: 0.3 mg/dL (ref 0.0–1.2)
CO2: 24 mmol/L (ref 20–29)
Calcium: 9.5 mg/dL (ref 8.7–10.2)
Chloride: 102 mmol/L (ref 96–106)
Creatinine, Ser: 0.76 mg/dL (ref 0.57–1.00)
Globulin, Total: 3.1 g/dL (ref 1.5–4.5)
Glucose: 98 mg/dL (ref 70–99)
Potassium: 4.1 mmol/L (ref 3.5–5.2)
Sodium: 140 mmol/L (ref 134–144)
Total Protein: 7.5 g/dL (ref 6.0–8.5)
eGFR: 91 mL/min/{1.73_m2} (ref >59)

## 2022-08-30 LAB — CBC
Hematocrit: 38.8 % (ref 34.0–46.6)
Hemoglobin: 12.6 g/dL (ref 11.1–15.9)
MCH: 29.5 pg (ref 26.6–33.0)
MCHC: 32.5 g/dL (ref 31.5–35.7)
MCV: 91 fL (ref 79–97)
Platelets: 224 10*3/uL (ref 150–450)
RBC: 4.27 x10E6/uL (ref 3.77–5.28)
RDW: 12.2 % (ref 11.7–15.4)
WBC: 4.9 10*3/uL (ref 3.4–10.8)

## 2022-08-30 LAB — LIPID PANEL
Chol/HDL Ratio: 2.9 ratio (ref 0.0–4.4)
Cholesterol, Total: 184 mg/dL (ref 100–199)
HDL: 63 mg/dL (ref >39)
LDL Chol Calc (NIH): 110 mg/dL — ABNORMAL HIGH (ref 0–99)
Triglycerides: 56 mg/dL (ref 0–149)
VLDL Cholesterol Cal: 11 mg/dL (ref 5–40)

## 2022-08-30 LAB — TSH: TSH: 1.19 u[IU]/mL (ref 0.450–4.500)

## 2022-09-19 ENCOUNTER — Ambulatory Visit: Payer: BC Managed Care – PPO | Admitting: Obstetrics and Gynecology

## 2022-09-24 NOTE — Progress Notes (Signed)
57 y.o. G104P1001 Married [place] American female here for annual exam.    Denies bleeding.   Wants a pap and HR HPV. She understands insurance may not pay.  PCP:   Dr. Fortunato Curling  Patient's last menstrual period was 03/27/2019 (exact date).           Sexually active: Yes.    The current method of family planning is post menopausal status.    Exercising: No.     Smoker:  no  Health Maintenance: Pap:  09/13/21 neg: HR HPV neg, 09/06/20 neg: HR HPV neg History of abnormal Pap:  yes,  Hx AGUS pap 2015, neg HR HPV, colpo ECC and exocervical bx negative, EMB negative.  MMG:  04/19/22 Breast Density Category C, BI-RADS CATEGORY 1 Neg Colonoscopy:  02/12/18 polyps, due 01/2023.  She will call in April to schedule. BMD:   n/a  Result  n/a TDaP:  08/24/20 Gardasil:   no HIV: 02/19/16 neg Hep C: 01/30/16 neg Screening Labs:  PCP   reports that she has never smoked. She has never used smokeless tobacco. She reports that she does not drink alcohol and does not use drugs.  Past Medical History:  Diagnosis Date   Abnormal Pap smear of cervix 01-31-14   --AGUS pap with neg HR HPV with [facility]   Fibroid    Personal history of adenomatous colonic polyp 08/18/2012   07/2012 - 5 mm adenoma    Past Surgical History:  Procedure Laterality Date   COLONOSCOPY  07/2012, 01/2018    Current Outpatient Medications  Medication Sig Dispense Refill   B Complex-C (SUPER B COMPLEX PO) Take by mouth.     Cholecalciferol (VITAMIN D3 PO) Take by mouth.     diclofenac Sodium (VOLTAREN) 1 % GEL Apply 2 g topically 4 (four) times daily. 50 g 1   multivitamin-iron-minerals-folic acid (CENTRUM) chewable tablet Chew 1 tablet by mouth daily.     vitamin C (ASCORBIC ACID) 500 MG tablet Take 500 mg by mouth daily.     VITAMIN E PO Take by mouth.     zinc gluconate 50 MG tablet Take 50 mg by mouth daily.     Zoster Vaccine Adjuvanted [facility]) injection Administer Shingrix vaccination now and repeat in two months  1 each 1   No current facility-administered medications for this visit.    Family History  Problem Relation Age of Onset   Diabetes Mother    Hypertension Mother    Cancer Mother        Colon, diagnosed late 50s   Colon cancer Mother 5   Cancer Father    Stomach cancer Father 41   Diabetes Sister    Hypertension Sister    Seizures Brother    Diabetes Brother    Diabetes Brother    Cancer Brother        Dec lung CA at 68, was a smoker   Esophageal cancer Neg Hx    Rectal cancer Neg Hx     Review of Systems  All other systems reviewed and are negative.   Exam:   BP 120/84 (BP Location: Left Arm, Patient Position: Sitting, Cuff Size: Normal)   Pulse 96   Ht 4' 10.5" (1.486 m)   Wt 162 lb (73.5 kg)   LMP 03/27/2019 (Exact Date)   SpO2 99%   BMI 33.28 kg/m     General appearance: alert, cooperative and appears stated age Head: normocephalic, without obvious abnormality, atraumatic Neck: no adenopathy, supple, symmetrical,  trachea midline and thyroid normal to inspection and palpation Lungs: clear to auscultation bilaterally Breasts: normal appearance, no masses or tenderness, No nipple retraction or dimpling, No nipple discharge or bleeding, No axillary adenopathy Heart: regular rate and rhythm Abdomen: soft, non-tender; no masses, no organomegaly Extremities: extremities normal, atraumatic, no cyanosis or edema Skin: skin color, texture, turgor normal. No rashes or lesions Lymph nodes: cervical, supraclavicular, and axillary nodes normal. Neurologic: grossly normal  Pelvic: External genitalia:  no lesions              No abnormal inguinal nodes palpated.              Urethra:  normal appearing urethra with no masses, tenderness or lesions              Bartholins and Skenes: normal                 Vagina: normal appearing vagina with normal color and discharge, no lesions              Cervix: no lesions              Pap taken: yes Bimanual Exam:  Uterus:  normal  size, contour, position, consistency, mobility, non-tender              Adnexa: no mass, fullness, tenderness              Rectal exam: yes.  Confirms.              Anus:  normal sphincter tone, no lesions  Chaperone was present for exam:  Raquel Sarna  Assessment:   Well woman visit with gynecologic exam. Hx prior AGUS pap and negative work up.  Fibroid uterus.  FH of colon, stomach, and lung cancer.  Plan: Mammogram screening discussed. Self breast awareness reviewed. Pap and HR HPV collected. Guidelines for Calcium, Vitamin D, regular exercise program including cardiovascular and weight bearing exercise.   Follow up annually and prn.   After visit summary provided.

## 2022-10-08 ENCOUNTER — Encounter: Payer: Self-pay | Admitting: Obstetrics and Gynecology

## 2022-10-08 ENCOUNTER — Other Ambulatory Visit (HOSPITAL_COMMUNITY)
Admission: RE | Admit: 2022-10-08 | Discharge: 2022-10-08 | Disposition: A | Discharge status: Home / Self Care | Payer: BC Managed Care – PPO | Source: Ambulatory Visit | Attending: Obstetrics and Gynecology | Admitting: Obstetrics and Gynecology

## 2022-10-08 ENCOUNTER — Ambulatory Visit (INDEPENDENT_AMBULATORY_CARE_PROVIDER_SITE_OTHER): Payer: BC Managed Care – PPO | Admitting: Obstetrics and Gynecology

## 2022-10-08 VITALS — BP 120/84 | HR 96 | Ht 58.5 in | Wt 162.0 lb

## 2022-10-08 DIAGNOSIS — Z124 Encounter for screening for malignant neoplasm of cervix: Secondary | ICD-10-CM | POA: Insufficient documentation

## 2022-10-08 DIAGNOSIS — Z01419 Encounter for gynecological examination (general) (routine) without abnormal findings: Secondary | ICD-10-CM | POA: Diagnosis not present

## 2022-10-08 NOTE — Patient Instructions (Signed)

## 2022-10-10 LAB — CYTOLOGY - PAP
Comment: NEGATIVE
Diagnosis: NEGATIVE
High risk HPV: NEGATIVE

## 2022-12-13 ENCOUNTER — Encounter: Payer: Self-pay | Admitting: Internal Medicine

## 2023-01-22 ENCOUNTER — Ambulatory Visit (AMBULATORY_SURGERY_CENTER): Payer: BC Managed Care – PPO | Admitting: *Deleted

## 2023-01-22 VITALS — Ht 59.0 in | Wt 160.0 lb

## 2023-01-22 DIAGNOSIS — Z85038 Personal history of other malignant neoplasm of large intestine: Secondary | ICD-10-CM

## 2023-01-22 DIAGNOSIS — Z86010 Personal history of colonic polyps: Secondary | ICD-10-CM

## 2023-01-22 NOTE — Progress Notes (Signed)
Pt's name and DOB verified at the beginning of the pre-visit.  Pt denies any difficulty with ambulating,sitting, laying down or rolling side to side Gave both [facility] main # and MD on call # prior to instructions.  No egg or soy allergy known to patient  No issues known to pt with past sedation with any surgeries or procedures Patient denies ever being intubated Pt has no issues moving head neck or swallowing No FH of Malignant Hyperthermia Pt is not on diet pills Pt is not on home 02  Pt is not on blood thinners  Pt denies issues with constipation  Pt is not on dialysis Pt denise any abnormal heart rhythms  Pt denies any upcoming cardiac testing Pt encouraged to use to use Singlecare or Goodrx to reduce cost  Patient's chart reviewed by Cathlyn Parsons CNRA prior to pre-visit and patient appropriate for the [facility].  Pre-visit completed and red dot placed by patient's name on their procedure day (on provider's schedule).  . Visit by phone Pt states weight is 160lb Instructed pt why it is important to and  to call if they have any changes in health or new medications. Directed them to the # given and on instructions.   Pt states they will.  Instructions reviewed with pt and pt states understanding. Instructed to review again prior to procedure. Pt states they will.  Instructions sent by mail with coupon and by my chart

## 2023-02-03 ENCOUNTER — Encounter: Payer: Self-pay | Admitting: Internal Medicine

## 2023-02-13 ENCOUNTER — Encounter: Payer: Self-pay | Admitting: Certified Registered Nurse Anesthetist

## 2023-02-19 ENCOUNTER — Ambulatory Visit (AMBULATORY_SURGERY_CENTER): Payer: BC Managed Care – PPO | Admitting: Internal Medicine

## 2023-02-19 ENCOUNTER — Encounter: Payer: Self-pay | Admitting: Internal Medicine

## 2023-02-19 VITALS — BP 119/78 | HR 61 | Temp 98.2°F | Resp 13 | Ht 59.0 in | Wt 160.0 lb

## 2023-02-19 DIAGNOSIS — D123 Benign neoplasm of transverse colon: Secondary | ICD-10-CM

## 2023-02-19 DIAGNOSIS — Z85038 Personal history of other malignant neoplasm of large intestine: Secondary | ICD-10-CM | POA: Diagnosis not present

## 2023-02-19 DIAGNOSIS — Z08 Encounter for follow-up examination after completed treatment for malignant neoplasm: Secondary | ICD-10-CM

## 2023-02-19 DIAGNOSIS — D120 Benign neoplasm of cecum: Secondary | ICD-10-CM

## 2023-02-19 DIAGNOSIS — Z86010 Personal history of colonic polyps: Secondary | ICD-10-CM

## 2023-02-19 DIAGNOSIS — Z1211 Encounter for screening for malignant neoplasm of colon: Secondary | ICD-10-CM | POA: Diagnosis not present

## 2023-02-19 MED ORDER — SODIUM CHLORIDE 0.9 % IV SOLN: 500.0000 mL | Freq: Once | INTRAVENOUS | Status: DC

## 2023-02-19 NOTE — Progress Notes (Signed)
Report given to PACU, vss 

## 2023-02-19 NOTE — Progress Notes (Signed)
Vitals-DT  Pt's states no medical or surgical changes since previsit or office visit.  

## 2023-02-19 NOTE — Op Note (Signed)
[facility] Patient Name: Samantha Key Procedure Date: 02/19/2023 9:07 AM MRN: [id number] Endoscopist: Iva Boop , MD, [id number] Age: 57 Referring MD:  Date of Birth: Oct 19, 1965 Gender: Female Account #: [id number] Procedure:                Colonoscopy Indications:              Surveillance: Personal history of adenomatous                            polyps on last colonoscopy 5 years ago, Last                            colonoscopy: June 2019 Medicines:                Monitored Anesthesia Care Procedure:                Pre-Anesthesia Assessment:                           - Prior to the procedure, a History and Physical                            was performed, and patient medications and                            allergies were reviewed. The patient's tolerance of                            previous anesthesia was also reviewed. The risks                            and benefits of the procedure and the sedation                            options and risks were discussed with the patient.                            All questions were answered, and informed consent                            was obtained. Prior Anticoagulants: The patient has                            taken no anticoagulant or antiplatelet agents. ASA                            Grade Assessment: II - A patient with mild systemic                            disease. After reviewing the risks and benefits,                            the patient was deemed in satisfactory condition to  undergo the procedure.                           After obtaining informed consent, the colonoscope                            was passed under direct vision. Throughout the                            procedure, the patient's blood pressure, pulse, and                            oxygen saturations were monitored continuously. The                            Olympus PCF-H190DL (#[id number]) Colonoscope  was                            introduced through the anus and advanced to the the                            cecum, identified by appendiceal orifice and                            ileocecal valve. The colonoscopy was performed                            without difficulty. The patient tolerated the                            procedure well. The quality of the bowel                            preparation was good. The ileocecal valve,                            appendiceal orifice, and rectum were photographed.                            The bowel preparation used was Miralax via split                            dose instruction. Scope In: 9:21:12 AM Scope Out: 9:41:14 AM Scope Withdrawal Time: 0 hours 16 minutes 14 seconds  Total Procedure Duration: 0 hours 20 minutes 2 seconds  Findings:                 The perianal and digital rectal examinations were                            normal.                           Two sessile polyps were found in the transverse  colon and cecum. The polyps were 1 to 2 mm in size.                            These polyps were removed with a cold biopsy                            forceps. Resection and retrieval were complete.                            Verification of patient identification for the                            specimen was done. Estimated blood loss was minimal.                           The exam was otherwise without abnormality on                            direct and retroflexion views. Complications:            No immediate complications. Estimated Blood Loss:     Estimated blood loss was minimal. Impression:               - Two 1 to 2 mm polyps in the transverse colon and                            in the cecum, removed with a cold biopsy forceps.                            Resected and retrieved.                           - The examination was otherwise normal on direct                            and  retroflexion views.                           - Personal history of colonic polyps. 07/2012 - 5                            mm adenoma                           02/12/2018 2 diminutive polyps removed adenomas Recommendation:           - Patient has a contact number available for                            emergencies. The signs and symptoms of potential                            delayed complications were discussed with the  patient. Return to normal activities tomorrow.                            Written discharge instructions were provided to the                            patient.                           - Resume previous diet.                           - Continue present medications.                           - Repeat colonoscopy is recommended for                            surveillance. The colonoscopy date will be                            determined after pathology results from today's                            exam become available for review. Iva Boop, MD 02/19/2023 9:50:55 AM This report has been signed electronically.

## 2023-02-19 NOTE — Progress Notes (Signed)
Called to room to assist during endoscopic procedure.  Patient ID and intended procedure confirmed with present staff. Received instructions for my participation in the procedure from the performing physician.  

## 2023-02-19 NOTE — Patient Instructions (Addendum)
I found and removed 2 tiny polyps. All else ok.  I will let you know pathology results and when to have another routine colonoscopy by mail and/or My Chart.  I appreciate the opportunity to care for you.  Iva Boop, MD, [facility]  Handouts Provided:  Polyps  YOU HAD AN ENDOSCOPIC PROCEDURE TODAY AT THE [facility]:   Refer to the procedure report that was given to you for any specific questions about what was found during the examination.  If the procedure report does not answer your questions, please call your gastroenterologist to clarify.  If you requested that your care partner not be given the details of your procedure findings, then the procedure report has been included in a sealed envelope for you to review at your convenience later.  YOU SHOULD EXPECT: Some feelings of bloating in the abdomen. Passage of more gas than usual.  Walking can help get rid of the air that was put into your GI tract during the procedure and reduce the bloating. If you had a lower endoscopy (such as a colonoscopy or flexible sigmoidoscopy) you may notice spotting of blood in your stool or on the toilet paper. If you underwent a bowel prep for your procedure, you may not have a normal bowel movement for a few days.  Please Note:  You might notice some irritation and congestion in your nose or some drainage.  This is from the oxygen used during your procedure.  There is no need for concern and it should clear up in a day or so.  SYMPTOMS TO REPORT IMMEDIATELY:  Following lower endoscopy (colonoscopy or flexible sigmoidoscopy):  Excessive amounts of blood in the stool  Significant tenderness or worsening of abdominal pains  Swelling of the abdomen that is new, acute  Fever of 100F or higher  For urgent or emergent issues, a gastroenterologist can be reached at any hour by calling [phone]. Do not use MyChart messaging for urgent concerns.    DIET:  We do recommend a small meal at  first, but then you may proceed to your regular diet.  Drink plenty of fluids but you should avoid alcoholic beverages for 24 hours.  ACTIVITY:  You should plan to take it easy for the rest of today and you should NOT DRIVE or use heavy machinery until tomorrow (because of the sedation medicines used during the test).    FOLLOW UP: Our staff will call the number listed on your records the next business day following your procedure.  We will call around 7:15- 8:00 am to check on you and address any questions or concerns that you may have regarding the information given to you following your procedure. If we do not reach you, we will leave a message.     If any biopsies were taken you will be contacted by phone or by letter within the next 1-3 weeks.  Please call us at [phone] if you have not heard about the biopsies in 3 weeks.    SIGNATURES/CONFIDENTIALITY: You and/or your care partner have signed paperwork which will be entered into your electronic medical record.  These signatures attest to the fact that that the information above on your After Visit Summary has been reviewed and is understood.  Full responsibility of the confidentiality of this discharge information lies with you and/or your care-partner.

## 2023-02-19 NOTE — Progress Notes (Signed)
[facility] History and Physical   Primary Care Physician:  Latrelle Dodrill, MD   Reason for Procedure:   Hx colon polyps  Plan:    colonoscopy     HPI: Samantha Key is a 57 y.o. female for surveillance exam  07/2012 - 5 mm adenoma 02/12/2018 2 diminutive polyps removed adenomas recall 2024   Past Surgical History:  Procedure Laterality Date   COLONOSCOPY  07/2012, 01/2018    Prior to Admission medications   Medication Sig Start Date End Date Taking? Authorizing Provider  B Complex-C (SUPER B COMPLEX PO) Take by mouth.   Yes [provider]  Cholecalciferol (VITAMIN D3 PO) Take by mouth.   Yes [provider]  multivitamin-iron-minerals-folic acid (CENTRUM) chewable tablet Chew 1 tablet by mouth daily.   Yes [provider]  vitamin C (ASCORBIC ACID) 500 MG tablet Take 500 mg by mouth daily.   Yes [provider]  zinc gluconate 50 MG tablet Take 50 mg by mouth daily.   Yes [provider]  diclofenac Sodium (VOLTAREN) 1 % GEL Apply 2 g topically 4 (four) times daily. 08/29/22   Latrelle Dodrill, MD  VITAMIN E PO Take by mouth.    [provider]  Zoster Vaccine Adjuvanted [facility]) injection Administer Shingrix vaccination now and repeat in two months Patient not taking: Reported on 01/22/2023 08/29/22   Latrelle Dodrill, MD    Current Outpatient Medications  Medication Sig Dispense Refill   B Complex-C (SUPER B COMPLEX PO) Take by mouth.     Cholecalciferol (VITAMIN D3 PO) Take by mouth.     multivitamin-iron-minerals-folic acid (CENTRUM) chewable tablet Chew 1 tablet by mouth daily.     vitamin C (ASCORBIC ACID) 500 MG tablet Take 500 mg by mouth daily.     zinc gluconate 50 MG tablet Take 50 mg by mouth daily.     diclofenac Sodium (VOLTAREN) 1 % GEL Apply 2 g topically 4 (four) times daily. 50 g 1   VITAMIN E PO Take by mouth.     Zoster Vaccine Adjuvanted [facility]) injection  Administer Shingrix vaccination now and repeat in two months (Patient not taking: Reported on 01/22/2023) 1 each 1   Current Facility-Administered Medications  Medication Dose Route Frequency Provider Last Rate Last Admin   0.9 %  sodium chloride infusion  500 mL Intravenous Once Iva Boop, MD        Allergies as of 02/19/2023   (No Known Allergies)    Family History  Problem Relation Age of Onset   Diabetes Mother    Hypertension Mother    Cancer Mother        Colon, diagnosed late 46s   Colon cancer Mother 72   Cancer Father    Stomach cancer Father 23   Diabetes Sister    Hypertension Sister    Seizures Brother    Diabetes Brother    Diabetes Brother    Cancer Brother        Dec lung CA at 18, was a smoker   Esophageal cancer Neg Hx    Rectal cancer Neg Hx    Colon polyps Neg Hx     Social History   Socioeconomic History   Marital status: Married    Spouse name: Not on file   Number of children: Not on file   Years of education: Not on file   Highest education level: Not on file  Occupational History   Not on file  Tobacco Use   Smoking status: Never   Smokeless tobacco: Never  Vaping Use   Vaping Use: Never used  Substance and Sexual Activity   Alcohol use: No   Drug use: No   Sexual activity: Yes    Partners: Male    Birth control/protection: Post-menopausal  Other Topics Concern   Not on file  Social History Narrative   Not on file   Social Determinants of Health   Financial Resource Strain: Not on file  Food Insecurity: Not on file  Transportation Needs: Not on file  Physical Activity: Not on file  Stress: Not on file  Social Connections: Not on file  Intimate Partner Violence: Not on file    Review of Systems:  All other review of systems negative except as mentioned in the HPI.  Physical Exam: Vital signs BP (!) 141/86 (BP Location: Right Arm, Patient Position: Sitting, Cuff Size: Normal)   Pulse 78   Temp 98.2 F (36.8 C)  (Temporal)   Ht 4\' 11"  (1.499 m)   Wt 160 lb (72.6 kg)   LMP 03/27/2019 (Exact Date)   SpO2 100%   BMI 32.32 kg/m   General:   Alert,  Well-developed, well-nourished, pleasant and cooperative in NAD Lungs:  Clear throughout to auscultation.   Heart:  Regular rate and rhythm; no murmurs, clicks, rubs,  or gallops. Abdomen:  Soft, nontender and nondistended. Normal bowel sounds.   Neuro/Psych:  Alert and cooperative. Normal mood and affect. A and O x 3   @Shedrick Sarli  Sena Slate, MD, [facility] [phone] (pager) 02/19/2023 9:10 AM@

## 2023-02-20 ENCOUNTER — Telehealth: Payer: Self-pay | Admitting: *Deleted

## 2023-02-20 NOTE — Telephone Encounter (Signed)
  Follow up Call-     02/19/2023    8:50 AM 02/19/2023    8:43 AM  Call back number  Post procedure Call Back phone  # [phone]   Permission to leave phone message  Yes     Patient questions:  Do you have a fever, pain , or abdominal swelling? No. Pain Score  0 *  Have you tolerated food without any problems? Yes.    Have you been able to return to your normal activities? Yes.    Do you have any questions about your discharge instructions: Diet   No. Medications  No. Follow up visit  No.  Do you have questions or concerns about your Care? No.  Actions: * If pain score is 4 or above: No action needed, pain <4.

## 2023-03-02 ENCOUNTER — Encounter: Payer: Self-pay | Admitting: Internal Medicine

## 2023-04-01 ENCOUNTER — Other Ambulatory Visit: Payer: Self-pay | Admitting: Family Medicine

## 2023-04-01 DIAGNOSIS — Z1231 Encounter for screening mammogram for malignant neoplasm of breast: Secondary | ICD-10-CM

## 2023-04-24 ENCOUNTER — Ambulatory Visit
Admission: RE | Admit: 2023-04-24 | Discharge: 2023-04-24 | Disposition: A | Discharge status: Home / Self Care | Payer: BC Managed Care – PPO | Source: Ambulatory Visit | Attending: Family Medicine | Admitting: Family Medicine

## 2023-04-24 DIAGNOSIS — Z1231 Encounter for screening mammogram for malignant neoplasm of breast: Secondary | ICD-10-CM | POA: Diagnosis not present

## 2023-09-01 ENCOUNTER — Encounter: Payer: BC Managed Care – PPO | Admitting: Family Medicine

## 2023-09-12 ENCOUNTER — Ambulatory Visit (INDEPENDENT_AMBULATORY_CARE_PROVIDER_SITE_OTHER): Payer: BC Managed Care – PPO | Admitting: Family Medicine

## 2023-09-12 ENCOUNTER — Encounter: Payer: BC Managed Care – PPO | Admitting: Family Medicine

## 2023-09-12 ENCOUNTER — Encounter: Payer: Self-pay | Admitting: Family Medicine

## 2023-09-12 VITALS — BP 141/88 | HR 86 | Ht 59.0 in | Wt 167.2 lb

## 2023-09-12 DIAGNOSIS — R7303 Prediabetes: Secondary | ICD-10-CM | POA: Diagnosis not present

## 2023-09-12 DIAGNOSIS — R030 Elevated blood-pressure reading, without diagnosis of hypertension: Secondary | ICD-10-CM

## 2023-09-12 DIAGNOSIS — Z23 Encounter for immunization: Secondary | ICD-10-CM | POA: Diagnosis not present

## 2023-09-12 DIAGNOSIS — Z0000 Encounter for general adult medical examination without abnormal findings: Secondary | ICD-10-CM | POA: Insufficient documentation

## 2023-09-12 DIAGNOSIS — S161XXA Strain of muscle, fascia and tendon at neck level, initial encounter: Secondary | ICD-10-CM

## 2023-09-12 LAB — POCT GLYCOSYLATED HEMOGLOBIN (HGB A1C): Hemoglobin A1C: 5.6 % (ref 4.0–5.6)

## 2023-09-12 MED ORDER — SHINGRIX 50 MCG/0.5ML IM SUSR: INTRAMUSCULAR | 1 refills | Status: AC

## 2023-09-12 MED ORDER — DICLOFENAC SODIUM 1 % EX GEL
2.0000 g | Freq: Four times a day (QID) | CUTANEOUS | 1 refills | Status: AC
Start: 2023-09-12 — End: ?

## 2023-09-12 NOTE — Assessment & Plan Note (Signed)
-  STD screening: declines -pap smear: UTD, sees GYN -mammogram: UTD -lipid screening: update lipids today -colon cancer screening: colonoscopy June 2024 with 2 diminutive adenomas, next due in 2029 -immunizations:  Flu: Given today Tdap: Up-to-date Shingrix: Provided prescription to get at her pharmacy COVID: Declines booster -handout given on health maintenance topics

## 2023-09-12 NOTE — Patient Instructions (Addendum)
It was great to see you again today.  Checking kidneys, liver, cholesterol, A1c today  Flu shot today  Sent in Voltaren gel  You can get shingles vaccine at your pharmacy  Return for blood pressure check in 1-2 weeks  Can also check at home, ideally want <140/90  Be well, Dr. Pollie Meyer  Health Maintenance, Female Adopting a healthy lifestyle and getting preventive care are important in promoting health and wellness. Ask your health care provider about: The right schedule for you to have regular tests and exams. Things you can do on your own to prevent diseases and keep yourself healthy. What should I know about diet, weight, and exercise? Eat a healthy diet  Eat a diet that includes plenty of vegetables, fruits, low-fat dairy products, and lean protein. Do not eat a lot of foods that are high in solid fats, added sugars, or sodium. Maintain a healthy weight Body mass index (BMI) is used to identify weight problems. It estimates body fat based on height and weight. Your health care provider can help determine your BMI and help you achieve or maintain a healthy weight. Get regular exercise Get regular exercise. This is one of the most important things you can do for your health. Most adults should: Exercise for at least 150 minutes each week. The exercise should increase your heart rate and make you sweat (moderate-intensity exercise). Do strengthening exercises at least twice a week. This is in addition to the moderate-intensity exercise. Spend less time sitting. Even light physical activity can be beneficial. Watch cholesterol and blood lipids Have your blood tested for lipids and cholesterol at 60 years of age, then have this test every 5 years. Have your cholesterol levels checked more often if: Your lipid or cholesterol levels are high. You are older than 60 years of age. You are at high risk for heart disease. What should I know about cancer screening? Depending on your  health history and family history, you may need to have cancer screening at various ages. This may include screening for: Breast cancer. Cervical cancer. Colorectal cancer. Skin cancer. Lung cancer. What should I know about heart disease, diabetes, and high blood pressure? Blood pressure and heart disease High blood pressure causes heart disease and increases the risk of stroke. This is more likely to develop in people who have high blood pressure readings or are overweight. Have your blood pressure checked: Every 3-5 years if you are 37-41 years of age. Every year if you are 64 years old or older. Diabetes Have regular diabetes screenings. This checks your fasting blood sugar level. Have the screening done: Once every three years after age 10 if you are at a normal weight and have a low risk for diabetes. More often and at a younger age if you are overweight or have a high risk for diabetes. What should I know about preventing infection? Hepatitis B If you have a higher risk for hepatitis B, you should be screened for this virus. Talk with your health care provider to find out if you are at risk for hepatitis B infection. Hepatitis C Testing is recommended for: Everyone born from [birth year] through 1967. Anyone with known risk factors for hepatitis C. Sexually transmitted infections (STIs) Get screened for STIs, including gonorrhea and chlamydia, if: You are sexually active and are younger than 60 years of age. You are older than 60 years of age and your health care provider tells you that you are at risk for this type of infection.  Your sexual activity has changed since you were last screened, and you are at increased risk for chlamydia or gonorrhea. Ask your health care provider if you are at risk. Ask your health care provider about whether you are at high risk for HIV. Your health care provider may recommend a prescription medicine to help prevent HIV infection. If you choose to take  medicine to prevent HIV, you should first get tested for HIV. You should then be tested every 3 months for as long as you are taking the medicine. Pregnancy If you are about to stop having your period (premenopausal) and you may become pregnant, seek counseling before you get pregnant. Take 400 to 800 micrograms (mcg) of folic acid every day if you become pregnant. Ask for birth control (contraception) if you want to prevent pregnancy. Osteoporosis and menopause Osteoporosis is a disease in which the bones lose minerals and strength with aging. This can result in bone fractures. If you are 26 years old or older, or if you are at risk for osteoporosis and fractures, ask your health care provider if you should: Be screened for bone loss. Take a calcium or vitamin D supplement to lower your risk of fractures. Be given hormone replacement therapy (HRT) to treat symptoms of menopause. Follow these instructions at home: Alcohol use Do not drink alcohol if: Your health care provider tells you not to drink. You are pregnant, may be pregnant, or are planning to become pregnant. If you drink alcohol: Limit how much you have to: 0-1 drink a day. Know how much alcohol is in your drink. In the [place] one drink equals one 12 oz bottle of beer (355 mL), one 5 oz glass of wine (148 mL), or one 1 oz glass of hard liquor (44 mL). Lifestyle Do not use any products that contain nicotine or tobacco. These products include cigarettes, chewing tobacco, and vaping devices, such as e-cigarettes. If you need help quitting, ask your health care provider. Do not use street drugs. Do not share needles. Ask your health care provider for help if you need support or information about quitting drugs. General instructions Schedule regular health, dental, and eye exams. Stay current with your vaccines. Tell your health care provider if: You often feel depressed. You have ever been abused or do not feel safe at  home. Summary Adopting a healthy lifestyle and getting preventive care are important in promoting health and wellness. Follow your health care provider's instructions about healthy diet, exercising, and getting tested or screened for diseases. Follow your health care provider's instructions on monitoring your cholesterol and blood pressure. This information is not intended to replace advice given to you by your health care provider. Make sure you discuss any questions you have with your health care provider. Document Revised: 01/01/2021 Document Reviewed: 01/01/2021 Elsevier Patient Education  2024 [employer].

## 2023-09-12 NOTE — Progress Notes (Unsigned)
  Date of Visit: 09/12/2023   SUBJECTIVE:   HPI:  Samantha Key presents today for a well woman exam.   Concerns today: Shoulder pain, see below STD Screening: Declines today Pap smear status: Sees GYN, up-to-date Exercise: Several times a week Smoking: No Alcohol: Occasional Drugs: No Mood: doing well, no concerns Dentist: yes has dentist Cancers in family: no  Shoulder pain - has pain in R neck, shoulder area, sometimes radiates down to breast. 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  OBJECTIVE:   BP (!) 134/95   Pulse 83   Ht 4\' 11"  (1.499 m)   Wt 167 lb 3.2 oz (75.8 kg)   LMP 03/27/2019 (Exact Date)   SpO2 100%   BMI 33.77 kg/m  Gen: NAD, pleasant, cooperative HEENT: NCAT, PERRL, no palpable thyromegaly or anterior cervical lymphadenopathy Heart: RRR, no murmurs Lungs: CTAB, NWOB Neuro: grossly nonfocal, speech normal Psych: normal range of affect, well groomed, speech normal in rate and volume, normal eye contact  Extremities: bilateral shoulders with full ROM.    ASSESSMENT/PLAN:   Assessment & Plan Routine adult health maintenance -STD screening: declines -pap smear: UTD, sees GYN -mammogram: UTD -lipid screening: update lipids today -colon cancer screening: colonoscopy June 2024 with 2 diminutive adenomas, next due in 2029 -immunizations:  Flu: Given today Tdap: Up-to-date Shingrix: Provided prescription to get at her pharmacy COVID: Declines booster -handout given on health maintenance topics Strain of neck muscle, initial encounter Trial of Voltaren gel, encouraged to stay active, follow-up if not improving with this.  Could consider imaging versus referral to sports medicine on follow-up. Elevated blood pressure reading BP elevated today, she will monitor it at home and return in 1 to 2 weeks for recheck.  If remains elevated at that time would likely start medication.  FOLLOW UP: Follow up in 1-2 wks for blood pressure recheck  [place].  Pollie Meyer, MD [facility]

## 2023-09-13 ENCOUNTER — Encounter: Payer: Self-pay | Admitting: Family Medicine

## 2023-09-13 LAB — CBC
Hematocrit: 38.7 % (ref 34.0–46.6)
Hemoglobin: 12.6 g/dL (ref 11.1–15.9)
MCH: 29.8 pg (ref 26.6–33.0)
MCHC: 32.6 g/dL (ref 31.5–35.7)
MCV: 92 fL (ref 79–97)
Platelets: 273 10*3/uL (ref 150–450)
RBC: 4.23 x10E6/uL (ref 3.77–5.28)
RDW: 12.3 % (ref 11.7–15.4)
WBC: 5.3 10*3/uL (ref 3.4–10.8)

## 2023-09-13 LAB — CMP14+EGFR
ALT: 19 [IU]/L (ref 0–32)
AST: 22 [IU]/L (ref 0–40)
Albumin: 4.4 g/dL (ref 3.8–4.9)
Alkaline Phosphatase: 81 [IU]/L (ref 44–121)
BUN/Creatinine Ratio: 15 (ref 9–23)
BUN: 12 mg/dL (ref 6–24)
Bilirubin Total: 0.3 mg/dL (ref 0.0–1.2)
CO2: 23 mmol/L (ref 20–29)
Calcium: 9.8 mg/dL (ref 8.7–10.2)
Chloride: 101 mmol/L (ref 96–106)
Creatinine, Ser: 0.78 mg/dL (ref 0.57–1.00)
Globulin, Total: 3.3 g/dL (ref 1.5–4.5)
Glucose: 96 mg/dL (ref 70–99)
Potassium: 4.3 mmol/L (ref 3.5–5.2)
Sodium: 140 mmol/L (ref 134–144)
Total Protein: 7.7 g/dL (ref 6.0–8.5)
eGFR: 87 mL/min/{1.73_m2} (ref >59)

## 2023-09-13 LAB — LIPID PANEL
Chol/HDL Ratio: 2.9 {ratio} (ref 0.0–4.4)
Cholesterol, Total: 193 mg/dL (ref 100–199)
HDL: 67 mg/dL (ref >39)
LDL Chol Calc (NIH): 113 mg/dL — ABNORMAL HIGH (ref 0–99)
Triglycerides: 70 mg/dL (ref 0–149)
VLDL Cholesterol Cal: 13 mg/dL (ref 5–40)

## 2023-09-14 NOTE — Assessment & Plan Note (Signed)
Trial of Voltaren gel, encouraged to stay active, follow-up if not improving with this.  Could consider imaging versus referral to sports medicine on follow-up.

## 2023-10-07 NOTE — Progress Notes (Signed)
 58 y.o. G33P1001 Married [place] American female here for annual exam.    Some vaginal dryness.  Cooking oil works well.   She wants a pap and HR HPV testing even if her insurance will not pay.   Just had a physical and labs with her PCP.  Mother turned 1 yo.   PCP: Latrelle Dodrill, MD   Patient's last menstrual period was 03/27/2019 (exact date).           Sexually active: Yes.    The current method of family planning is post menopausal status.    Menopausal hormone therapy:  n/a Exercising: No.   Smoker:  no  OB History  Gravida Para Term Preterm AB Living  1 1 1   1   SAB IAB Ectopic Multiple Live Births          # Outcome Date GA Lbr Len/2nd Weight Sex Type Anes PTL Lv  1 Term              HEALTH MAINTENANCE: Last 2 paps:  10/08/22 neg: HR HPV neg, 09/13/21 neg: HR HPV neg History of abnormal Pap or positive HPV:  no Mammogram:   04/24/23 Breast Density Cat C, BI-RADS CAT 1 neg Colonoscopy:  02/19/23 - polyps - due in 2029 Bone Density:  n/a  Result  n/a   Immunization History  Administered Date(s) Administered   Influenza, Seasonal, Injecte, Preservative Fre 09/12/2023   Influenza,inj,Quad PF,6+ Mos 08/07/2018, 08/10/2019, 08/28/2021, 08/29/2022   PFIZER(Purple Top)SARS-COV-2 Vaccination 11/16/2019, 12/14/2019, 08/04/2020   Td 06/27/1999   Tdap 10/19/2010, 08/24/2020      reports that she has never smoked. She has never used smokeless tobacco. She reports that she does not drink alcohol and does not use drugs.  Past Medical History:  Diagnosis Date   Abnormal Pap smear of cervix 01/31/2014   --AGUS pap with neg HR HPV with [facility]   Fibroid    Hyperlipidemia    No meds slight oer pt   Personal history of adenomatous colonic polyp 08/18/2012   07/2012 - 5 mm adenoma    Past Surgical History:  Procedure Laterality Date   COLONOSCOPY  07/2012, 01/2018    Current Outpatient Medications  Medication Sig Dispense Refill   B Complex-C (SUPER  B COMPLEX PO) Take by mouth.     Cholecalciferol (VITAMIN D3 PO) Take by mouth.     diclofenac Sodium (VOLTAREN) 1 % GEL Apply 2 g topically 4 (four) times daily. 50 g 1   multivitamin-iron-minerals-folic acid (CENTRUM) chewable tablet Chew 1 tablet by mouth daily.     vitamin C (ASCORBIC ACID) 500 MG tablet Take 500 mg by mouth daily.     VITAMIN E PO Take by mouth.     zinc gluconate 50 MG tablet Take 50 mg by mouth daily.     Zoster Vaccine Adjuvanted [facility]) injection Administer Shingrix vaccination now and repeat in two months 1 each 1   No current facility-administered medications for this visit.    ALLERGIES: Patient has no known allergies.  Family History  Problem Relation Age of Onset   Diabetes Mother    Hypertension Mother    Cancer Mother        Colon, diagnosed late 24s   Colon cancer Mother 56   Cancer Father    Stomach cancer Father 33   Diabetes Sister    Hypertension Sister    Seizures Brother    Diabetes Brother    Diabetes Brother  Cancer Brother        Dec lung CA at 44, was a smoker   Esophageal cancer Neg Hx    Rectal cancer Neg Hx    Colon polyps Neg Hx     Review of Systems  All other systems reviewed and are negative.   PHYSICAL EXAM:  BP 128/84 (BP Location: Left Arm, Patient Position: Sitting, Cuff Size: Small)   Pulse (!) 101   Ht 4' 10.5" (1.486 m)   Wt 163 lb (73.9 kg)   LMP 03/27/2019 (Exact Date)   SpO2 99%   BMI 33.49 kg/m     General appearance: alert, cooperative and appears stated age Head: normocephalic, without obvious abnormality, atraumatic Neck: no adenopathy, supple, symmetrical, trachea midline and thyroid normal to inspection and palpation Lungs: clear to auscultation bilaterally Breasts: normal appearance, no masses or tenderness, No nipple retraction or dimpling, No nipple discharge or bleeding, No axillary adenopathy Heart: regular rate and rhythm Abdomen: soft, non-tender; no masses, no  organomegaly Extremities: extremities normal, atraumatic, no cyanosis or edema Skin: skin color, texture, turgor normal. No rashes or lesions Lymph nodes: cervical, supraclavicular, and axillary nodes normal. Neurologic: grossly normal  Pelvic: External genitalia:  no lesions              No abnormal inguinal nodes palpated.              Urethra:  normal appearing urethra with no masses, tenderness or lesions              Bartholins and Skenes: normal                 Vagina: normal appearing vagina with normal color and discharge, no lesions              Cervix: no lesions              Pap taken: Yes.   Bimanual Exam:  Uterus:  normal size, contour, position, consistency, mobility, non-tender              Adnexa: no mass, fullness, tenderness              Rectal exam: Yes.  .  Confirms.              Anus:  normal sphincter tone, no lesions  Chaperone was present for exam:   Mel Almond, CMA  ASSESSMENT: Well woman visit with gynecologic exam. Cervical cancer screening.  Hx prior AGUS pap and negative work up.  Fibroid uterus.  FH of colon, stomach, and lung cancer. PHQ2:  0  PLAN: Mammogram screening discussed. Self breast awareness reviewed. Pap and HRV collected:  Yes.   Guidelines for Calcium, Vitamin D, regular exercise program including cardiovascular and weight bearing exercise. Medication refills:  NA Follow up:  yearly and prn.

## 2023-10-16 ENCOUNTER — Ambulatory Visit: Payer: BC Managed Care – PPO | Admitting: Obstetrics and Gynecology

## 2023-10-21 ENCOUNTER — Ambulatory Visit (INDEPENDENT_AMBULATORY_CARE_PROVIDER_SITE_OTHER): Payer: BC Managed Care – PPO | Admitting: Obstetrics and Gynecology

## 2023-10-21 ENCOUNTER — Other Ambulatory Visit (HOSPITAL_COMMUNITY)
Admission: RE | Admit: 2023-10-21 | Discharge: 2023-10-21 | Disposition: A | Discharge status: Home / Self Care | Payer: BC Managed Care – PPO | Source: Ambulatory Visit | Attending: Obstetrics and Gynecology | Admitting: Obstetrics and Gynecology

## 2023-10-21 ENCOUNTER — Encounter: Payer: Self-pay | Admitting: Obstetrics and Gynecology

## 2023-10-21 VITALS — BP 128/84 | HR 101 | Ht 58.5 in | Wt 163.0 lb

## 2023-10-21 DIAGNOSIS — Z1331 Encounter for screening for depression: Secondary | ICD-10-CM

## 2023-10-21 DIAGNOSIS — Z124 Encounter for screening for malignant neoplasm of cervix: Secondary | ICD-10-CM | POA: Diagnosis not present

## 2023-10-21 DIAGNOSIS — Z01419 Encounter for gynecological examination (general) (routine) without abnormal findings: Secondary | ICD-10-CM | POA: Diagnosis not present

## 2023-10-21 NOTE — Patient Instructions (Signed)

## 2023-10-23 LAB — CYTOLOGY - PAP
Comment: NEGATIVE
Diagnosis: NEGATIVE
Diagnosis: REACTIVE
High risk HPV: NEGATIVE

## 2023-10-29 ENCOUNTER — Encounter: Payer: Self-pay | Admitting: Obstetrics and Gynecology

## 2024-03-29 ENCOUNTER — Other Ambulatory Visit: Payer: Self-pay | Admitting: Family Medicine

## 2024-03-29 DIAGNOSIS — Z1231 Encounter for screening mammogram for malignant neoplasm of breast: Secondary | ICD-10-CM

## 2024-04-28 ENCOUNTER — Ambulatory Visit
Admission: RE | Admit: 2024-04-28 | Discharge: 2024-04-28 | Disposition: A | Discharge status: Home / Self Care | Source: Ambulatory Visit | Attending: Family Medicine | Admitting: Family Medicine

## 2024-04-28 DIAGNOSIS — Z1231 Encounter for screening mammogram for malignant neoplasm of breast: Secondary | ICD-10-CM

## 2024-09-24 ENCOUNTER — Ambulatory Visit: Admitting: Family Medicine

## 2024-09-24 ENCOUNTER — Encounter: Payer: Self-pay | Admitting: Family Medicine

## 2024-09-24 ENCOUNTER — Ambulatory Visit: Payer: Self-pay | Admitting: Family Medicine

## 2024-09-24 VITALS — BP 132/74 | HR 84 | Ht 59.5 in | Wt 169.4 lb

## 2024-09-24 DIAGNOSIS — R7303 Prediabetes: Secondary | ICD-10-CM | POA: Diagnosis not present

## 2024-09-24 DIAGNOSIS — Z0000 Encounter for general adult medical examination without abnormal findings: Secondary | ICD-10-CM

## 2024-09-24 LAB — POCT GLYCOSYLATED HEMOGLOBIN (HGB A1C): Hemoglobin A1C: 5.5 % (ref 4.0–5.6)

## 2024-09-24 NOTE — Progress Notes (Signed)
" ° ° °  SUBJECTIVE:   Chief compliant/HPI: annual examination  Katrin Francis Erker is a 59 y.o. who presents today for an annual exam.   No concerns today.  History tabs reviewed and updated.   OBJECTIVE:   BP 132/74   Pulse 84   Ht 4' 11.5 (1.511 m)   Wt 169 lb 6.4 oz (76.8 kg)   LMP 03/27/2019   SpO2 99%   BMI 33.64 kg/m   General: Alert and oriented, in NAD Skin: Warm, dry, and intact without lesions HEENT: NCAT, EOM grossly normal, midline nasal septum Cardiac: RRR, no m/r/g appreciated Respiratory: CTAB, breathing and speaking comfortably on RA Abdominal: Soft, nontender, nondistended, normoactive bowel sounds Extremities: Moves all extremities grossly equally Neurological: No gross focal deficit Psychiatric: Appropriate mood and affect   ASSESSMENT/PLAN:   Annual Examination  See AVS for age appropriate recommendations  PHQ score 0, reviewed.  BP reviewed and at goal.   Considered the following items based upon USPSTF recommendations: Diabetes screening: ordered HIV testing:discussed and declined Hepatitis C: discussed and declined Hepatitis B:discussed and declined Syphilis if at high risk: discussed and declined GC/CT not at high risk and not ordered. Lipid panel (nonfasting or fasting) discussed based upon AHA recommendations and ordered.  Osteoporosis screening considered based upon risk of fracture from [facility] calculator. Major osteoporotic fracture risk is 2.7%. DEXA not ordered.   Cancer Screening Discussion  Cervical cancer screening: UTD, next due 2030. Breast cancer screening: UTD, next due 04/2026 per USPSTF. Discussed family history, BRCA testing not indicated given no Fhx. Lung cancer screening:not indicated as does not meet criteria.  See documentation below regarding indications/risks/benefits.  Colorectal cancer screening: up to date on screening for CRC. Next due in 2029. Vaccinations: declined.   Follow up in 1 year or sooner if indicated.   MyChart Activation: Already signed up  Stuart Redo, MD [facility]   "

## 2024-09-24 NOTE — Patient Instructions (Signed)
 It was a pleasure meeting you today. Keep up the great work. I will send you a MyChart message with your test results. Come back in 1 year or sooner if needed.

## 2024-09-25 LAB — CBC WITH DIFFERENTIAL/PLATELET
Basophils Absolute: 0 10*3/uL (ref 0.0–0.2)
Basos: 0 %
EOS (ABSOLUTE): 0.1 10*3/uL (ref 0.0–0.4)
Eos: 1 %
Hematocrit: 40.3 % (ref 34.0–46.6)
Hemoglobin: 13 g/dL (ref 11.1–15.9)
Immature Grans (Abs): 0 10*3/uL (ref 0.0–0.1)
Immature Granulocytes: 0 %
Lymphocytes Absolute: 2.3 10*3/uL (ref 0.7–3.1)
Lymphs: 34 %
MCH: 29.4 pg (ref 26.6–33.0)
MCHC: 32.3 g/dL (ref 31.5–35.7)
MCV: 91 fL (ref 79–97)
Monocytes Absolute: 0.4 10*3/uL (ref 0.1–0.9)
Monocytes: 6 %
Neutrophils Absolute: 4 10*3/uL (ref 1.4–7.0)
Neutrophils: 59 %
Platelets: 260 10*3/uL (ref 150–450)
RBC: 4.42 x10E6/uL (ref 3.77–5.28)
RDW: 12.6 % (ref 11.7–15.4)
WBC: 6.8 10*3/uL (ref 3.4–10.8)

## 2024-09-25 LAB — COMPREHENSIVE METABOLIC PANEL WITH GFR
ALT: 18 [IU]/L (ref 0–32)
AST: 16 [IU]/L (ref 0–40)
Albumin: 4.4 g/dL (ref 3.8–4.9)
Alkaline Phosphatase: 75 [IU]/L (ref 49–135)
BUN/Creatinine Ratio: 18 (ref 12–28)
BUN: 14 mg/dL (ref 8–27)
Bilirubin Total: 0.4 mg/dL (ref 0.0–1.2)
CO2: 23 mmol/L (ref 20–29)
Calcium: 9.6 mg/dL (ref 8.7–10.3)
Chloride: 102 mmol/L (ref 96–106)
Creatinine, Ser: 0.8 mg/dL (ref 0.57–1.00)
Globulin, Total: 3.1 g/dL (ref 1.5–4.5)
Glucose: 100 mg/dL — ABNORMAL HIGH (ref 70–99)
Potassium: 4.3 mmol/L (ref 3.5–5.2)
Sodium: 142 mmol/L (ref 134–144)
Total Protein: 7.5 g/dL (ref 6.0–8.5)
eGFR: 84 mL/min/{1.73_m2}

## 2024-09-25 LAB — LIPID PANEL
Chol/HDL Ratio: 2.7 ratio (ref 0.0–4.4)
Cholesterol, Total: 202 mg/dL — ABNORMAL HIGH (ref 100–199)
HDL: 74 mg/dL
LDL Chol Calc (NIH): 114 mg/dL — ABNORMAL HIGH (ref 0–99)
Triglycerides: 79 mg/dL (ref 0–149)
VLDL Cholesterol Cal: 14 mg/dL (ref 5–40)

## 2024-09-30 ENCOUNTER — Encounter: Payer: Self-pay | Admitting: Family Medicine

## 2024-10-26 ENCOUNTER — Ambulatory Visit: Payer: BC Managed Care – PPO | Admitting: Obstetrics and Gynecology
